# Patient Record
Sex: Male | Born: 1950 | Race: White | Hispanic: No | Marital: Married | Smoking: Former smoker
Health system: Southern US, Community
[De-identification: ages and names within clinical notes are randomized; demographics above are authoritative.]

## PROBLEM LIST (undated history)

## (undated) DIAGNOSIS — K635 Polyp of colon: Secondary | ICD-10-CM

## (undated) DIAGNOSIS — J449 Chronic obstructive pulmonary disease, unspecified: Secondary | ICD-10-CM

## (undated) DIAGNOSIS — K219 Gastro-esophageal reflux disease without esophagitis: Secondary | ICD-10-CM

## (undated) DIAGNOSIS — K5790 Diverticulosis of intestine, part unspecified, without perforation or abscess without bleeding: Secondary | ICD-10-CM

## (undated) DIAGNOSIS — H3530 Unspecified macular degeneration: Secondary | ICD-10-CM

## (undated) HISTORY — DX: Polyp of colon: K63.5

## (undated) HISTORY — DX: Gastro-esophageal reflux disease without esophagitis: K21.9

## (undated) HISTORY — DX: Diverticulosis of intestine, part unspecified, without perforation or abscess without bleeding: K57.90

## (undated) HISTORY — DX: Chronic obstructive pulmonary disease, unspecified: J44.9

## (undated) HISTORY — DX: Unspecified macular degeneration: H35.30

---

## 2004-07-16 ENCOUNTER — Ambulatory Visit: Payer: Self-pay | Admitting: Unknown Physician Specialty

## 2006-12-01 ENCOUNTER — Ambulatory Visit: Payer: Self-pay | Admitting: Unknown Physician Specialty

## 2008-08-24 HISTORY — PX: HERNIA REPAIR: SHX51

## 2010-08-25 LAB — HM COLONOSCOPY

## 2012-01-26 ENCOUNTER — Ambulatory Visit: Payer: Self-pay | Admitting: Surgery

## 2012-01-26 LAB — BASIC METABOLIC PANEL
Anion Gap: 6 — ABNORMAL LOW (ref 7–16)
Chloride: 106 mmol/L (ref 98–107)
Co2: 29 mmol/L (ref 21–32)
Creatinine: 1.1 mg/dL (ref 0.60–1.30)
Glucose: 95 mg/dL (ref 65–99)
Potassium: 4.6 mmol/L (ref 3.5–5.1)
Sodium: 141 mmol/L (ref 136–145)

## 2012-01-26 LAB — CBC WITH DIFFERENTIAL/PLATELET
Eosinophil %: 5 %
Lymphocyte #: 1.4 10*3/uL (ref 1.0–3.6)
MCH: 29.8 pg (ref 26.0–34.0)
Monocyte #: 0.4 x10 3/mm (ref 0.2–1.0)
Neutrophil #: 3.6 10*3/uL (ref 1.4–6.5)
Neutrophil %: 63.2 %
WBC: 5.7 10*3/uL (ref 3.8–10.6)

## 2012-02-02 ENCOUNTER — Ambulatory Visit: Payer: Self-pay | Admitting: Surgery

## 2014-12-16 NOTE — Op Note (Signed)
PATIENT NAME:  Eric Stanton, Eric Stanton MR#:  [id number] DATE OF BIRTH:  10-17-1950  DATE OF PROCEDURE:  02/02/2012  PREOPERATIVE DIAGNOSIS: Umbilical hernia.   POSTOPERATIVE DIAGNOSIS: Umbilical hernia.   PROCEDURE: Umbilical hernia repair.   SURGEON: Quentin Orealph Stanton. Ely III, MD  ANESTHESIA: General.    DESCRIPTION OF PROCEDURE: With the patient in supine position after induction of appropriate general anesthesia, the patient's abdomen was prepped with ChloraPrep, draped with sterile towels. A curvilinear infraumbilical incision was made in the standard fashion, carried down bluntly through subcutaneous tissue. Hernia defect was identified with the preperitoneal fat in the sac. The sac was opened. Preperitoneal fat reduced. Sac amputated. The defect was approximately 2 cm in diameter. The decision was made to repair primarily rather than use mesh with a diameter of the defect. A pants over vest repair was accomplished with 0 Surgilon. The repair was reinforced with 0-0 Surgilon. The area was infiltrated with 0.25% Marcaine for postoperative pain control. The umbilical skin was reapproximated with 0 Vicryl and the subcutaneous space obliterated with 3-0 Vicryl. Skin was clipped. Compressive dressing was applied. The patient returned to recovery room having tolerated procedure well. Sponge, instrument, and needle counts were correct x2 in the Operating Room.   ____________________________ Quentin Orealph Stanton. Ely III, MD rle:cms D: 02/02/2012 08:18:04 ET T: 02/02/2012 11:24:05 ET JOB#: [id number]  cc: Eric Endalph Stanton. Ely III, MD, <Dictator> Eric GuildGlenn R. Beckey DowningWillett, MD Quentin OreALPH Stanton ELY MD ELECTRONICALLY SIGNED 02/03/2012 18:00

## 2018-11-01 ENCOUNTER — Other Ambulatory Visit: Payer: Self-pay | Admitting: Internal Medicine

## 2018-11-01 DIAGNOSIS — J449 Chronic obstructive pulmonary disease, unspecified: Secondary | ICD-10-CM | POA: Insufficient documentation

## 2018-11-01 DIAGNOSIS — K219 Gastro-esophageal reflux disease without esophagitis: Secondary | ICD-10-CM | POA: Insufficient documentation

## 2018-11-04 ENCOUNTER — Other Ambulatory Visit: Payer: Self-pay

## 2018-11-04 ENCOUNTER — Ambulatory Visit (INDEPENDENT_AMBULATORY_CARE_PROVIDER_SITE_OTHER): Payer: Medicare HMO | Admitting: Internal Medicine

## 2018-11-04 ENCOUNTER — Encounter: Payer: Self-pay | Admitting: Internal Medicine

## 2018-11-04 VITALS — BP 124/78 | HR 98 | Ht 71.0 in | Wt 177.0 lb

## 2018-11-04 DIAGNOSIS — J441 Chronic obstructive pulmonary disease with (acute) exacerbation: Secondary | ICD-10-CM | POA: Diagnosis not present

## 2018-11-04 DIAGNOSIS — Z8719 Personal history of other diseases of the digestive system: Secondary | ICD-10-CM | POA: Diagnosis not present

## 2018-11-04 DIAGNOSIS — K219 Gastro-esophageal reflux disease without esophagitis: Secondary | ICD-10-CM | POA: Diagnosis not present

## 2018-11-04 NOTE — Progress Notes (Signed)
Date:  11/04/2018   Name:  Eric Stanton   DOB:  05/14/1951   MRN:  [id number]  Transfer from [facility].  Chief Complaint: Establish Care (Currently on prednisone and amoxicillin from bronchitis from [facility] clinic. Wanted to change PCPs because he can never see one consistent doctor there. No pneumonia-clear xray. )  Gastroesophageal Reflux  He complains of coughing, heartburn and wheezing. He reports no abdominal pain or no chest pain. This is a chronic problem. The problem occurs rarely. The heartburn is located in the substernum. Pertinent negatives include no fatigue. He has tried a PPI for the symptoms. Past procedures do not include an EGD.  Cough  This is a new problem. The current episode started 1 to 4 weeks ago. The problem has been gradually improving. The cough is non-productive. Associated symptoms include heartburn and wheezing. Pertinent negatives include no chest pain, chills, fever, headaches, rash (rash resolved after benadryl - within 90 minutes) or shortness of breath. He has tried a beta-agonist inhaler and oral steroids (and antibiotics - just finishing augmentin) for the symptoms. The treatment provided significant relief. His past medical history is significant for COPD. There is no history of environmental allergies.  Rash - he developed a diffuse pruritic rash over his entire body after starting augmentin.  He took benadryl and the rash resolved after 90 minutes.  He continued on the augmentin and the rash has not recurred.  Review of Systems  Constitutional: Negative for chills, fatigue and fever.  Respiratory: Positive for cough and wheezing. Negative for chest tightness, shortness of breath and stridor.   Cardiovascular: Negative for chest pain, palpitations and leg swelling.  Gastrointestinal: Positive for heartburn. Negative for abdominal pain, diarrhea and vomiting.  Musculoskeletal: Negative for arthralgias, back pain and gait problem.  Skin: Negative for rash (rash  resolved after benadryl - within 90 minutes).  Allergic/Immunologic: Negative for environmental allergies.  Neurological: Negative for dizziness and headaches.  Psychiatric/Behavioral: Negative for sleep disturbance.    Patient Active Problem List   Diagnosis Date Noted  . COPD (chronic obstructive pulmonary disease) (HCC) 11/01/2018  . GERD (gastroesophageal reflux disease) 11/01/2018    Allergies  Allergen Reactions  . Codeine Other (See Comments)    Hyperactivity  . Oxycodone-Acetaminophen Other (See Comments)    Hyperactivity    Past Surgical History:  Procedure Laterality Date  . HERNIA REPAIR  2010    Social History   Tobacco Use  . Smoking status: Former Smoker    Packs/day: 2.00    Years: 20.00    Pack years: 40.00    Types: Cigarettes    Last attempt to quit: 1985    Years since quitting: 35.2  . Smokeless tobacco: Never Used  Substance Use Topics  . Alcohol use: Yes    Alcohol/week: 2.0 standard drinks    Types: 2 Standard drinks or equivalent per week  . Drug use: Never     Medication list has been reviewed and updated.  Current Meds  Medication Sig  . aspirin EC 81 MG tablet Take by mouth.  . docusate sodium (COLACE) 100 MG capsule Take by mouth.  . famotidine (PEPCID) 20 MG tablet Take 20 mg by mouth 2 (two) times daily.  . Ipratropium-Albuterol (COMBIVENT RESPIMAT) 20-100 MCG/ACT AERS respimat 1 INHALATION INTO THE LUNGS 4 TIMES DAILY AS NEEDED FOR WHEEZING PATIENT NEEDS APPT FOR REFILLS  . Multiple Vitamins-Minerals (PRESERVISION AREDS 2+MULTI VIT PO) Take by mouth.  . Omega-3 Fatty Acids (FISH OIL) 1000  MG CAPS Take by mouth.  . Saw Palmetto 1000 MG CAPS Take by mouth.  . vitamin B-12 (CYANOCOBALAMIN) 1000 MCG tablet Take 1,000 mcg by mouth daily.    PHQ 2/9 Scores 11/04/2018  PHQ - 2 Score 0    Physical Exam Vitals signs and nursing note reviewed.  Constitutional:      General: He is not in acute distress.    Appearance: Normal  appearance. He is well-developed.  HENT:     Head: Normocephalic and atraumatic.  Neck:     Musculoskeletal: Normal range of motion and neck supple.     Vascular: No carotid bruit.  Cardiovascular:     Rate and Rhythm: Normal rate and regular rhythm.     Pulses: Normal pulses.  Pulmonary:     Effort: Pulmonary effort is normal. No respiratory distress.     Breath sounds: Decreased breath sounds present. No wheezing or rhonchi.  Musculoskeletal: Normal range of motion.     Right lower leg: No edema.     Left lower leg: No edema.  Lymphadenopathy:     Cervical: No cervical adenopathy.  Skin:    General: Skin is warm and dry.     Findings: No rash.  Neurological:     Mental Status: He is alert and oriented to person, place, and time.  Psychiatric:        Attention and Perception: Attention normal.        Mood and Affect: Mood normal.        Behavior: Behavior normal.        Thought Content: Thought content normal.     Wt Readings from Last 3 Encounters:  11/04/18 177 lb (80.3 kg)    BP 124/78   Pulse 98   Ht 5\' 11"  (1.803 m)   Wt 177 lb (80.3 kg)   SpO2 96%   BMI 24.69 kg/m   Assessment and Plan: 1. Gastroesophageal reflux disease, esophagitis presence not specified Controlled on Pepcid  2. History of esophageal stricture No recent problems  3. Chronic obstructive pulmonary disease with acute exacerbation (HCC) Finish full course of antibiotics and prednisone Continue Combivent PRN   Partially dictated using Animal nutritionist. Any errors are unintentional.  Bari Edward, MD [facility]  11/04/2018

## 2018-12-07 ENCOUNTER — Ambulatory Visit: Payer: Medicare HMO

## 2018-12-16 ENCOUNTER — Other Ambulatory Visit: Payer: Self-pay | Admitting: Internal Medicine

## 2019-02-01 ENCOUNTER — Ambulatory Visit (INDEPENDENT_AMBULATORY_CARE_PROVIDER_SITE_OTHER): Payer: Medicare HMO

## 2019-02-01 VITALS — Ht 71.0 in | Wt 167.0 lb

## 2019-02-01 DIAGNOSIS — Z0000 Encounter for general adult medical examination without abnormal findings: Secondary | ICD-10-CM | POA: Diagnosis not present

## 2019-02-01 NOTE — Progress Notes (Signed)
Subjective:   Eric Stanton is a 68 y.o. male who presents for an Initial Medicare Annual Wellness Visit.  Virtual Visit via Telephone Note  I connected with Eric Stanton on 02/01/19 at  2:40 PM EDT by telephone and verified that I am speaking with the correct person using two identifiers.  Medicare Annual Wellnes visit completed telephonically due to Covid-19 pandemic.   Location: Patient: home Provider: office   I discussed the limitations, risks, security and privacy concerns of performing an evaluation and management service by telephone and the availability of in person appointments. The patient expressed understanding and agreed to proceed.  Some vital signs may be absent or patient reported. Patient states he does not have a blood pressure monitor at home.    Reather Littler, LPN   Review of Systems   Cardiac Risk Factors include: advanced age (>11men, >72 women);male gender    Objective:    Today's Vitals   02/01/19 1448  Weight: 167 lb (75.8 kg)  Height:  (1.803 m)   Body mass index is 23.29 kg/m.  Advanced Directives 02/01/2019  Does Patient Have a Medical Advance Directive? No  Would patient like information on creating a medical advance directive? Yes (MAU/Ambulatory/Procedural Areas - Information given)    Current Medications (verified) Outpatient Encounter Medications as of 02/01/2019  Medication Sig  . aspirin EC 81 MG tablet Take by mouth.  . COMBIVENT RESPIMAT 20-100 MCG/ACT AERS respimat 1 INHALATION INTO THE LUNGS 4 TIMES DAILY AS NEEDED FOR WHEEZING PATIENT NEEDS APPT FOR REFILLS  . docusate sodium (COLACE) 100 MG capsule Take by mouth.  . famotidine (PEPCID) 20 MG tablet Take 20 mg by mouth 2 (two) times daily.  . Multiple Vitamins-Minerals (PRESERVISION AREDS 2+MULTI VIT PO) Take by mouth.  . Omega-3 Fatty Acids (FISH OIL) 1000 MG CAPS Take by mouth.  . Saw Palmetto 1000 MG CAPS Take by mouth.  . vitamin B-12 (CYANOCOBALAMIN) 1000 MCG tablet  Take 1,000 mcg by mouth daily.   No facility-administered encounter medications on file as of 02/01/2019.     Allergies (verified) Codeine and Oxycodone-acetaminophen   History: Past Medical History:  Diagnosis Date  . Colon polyps   . COPD (chronic obstructive pulmonary disease) (HCC)   . Diverticulosis   . GERD (gastroesophageal reflux disease)    Past Surgical History:  Procedure Laterality Date  . HERNIA REPAIR  2010   Family History  Problem Relation Age of Onset  . Liver cancer Mother   . Encopresis Father   . Cancer Maternal Aunt   . Cancer Maternal Uncle    Social History   Socioeconomic History  . Marital status: Married    Spouse name: Not on file  . Number of children: 5  . Years of education: Not on file  . Highest education level: 10th grade  Occupational History  . Not on file  Social Needs  . Financial resource strain: Not hard at all  . Food insecurity:    Worry: Never true    Inability: Never true  . Transportation needs:    Medical: No    Non-medical: No  Tobacco Use  . Smoking status: Former Smoker    Packs/day: 2.00    Years: 20.00    Pack years: 40.00    Types: Cigarettes    Last attempt to quit: 1985    Years since quitting: 35.4  . Smokeless tobacco: Never Used  Substance and Sexual Activity  . Alcohol use: Yes  Alcohol/week: 2.0 standard drinks    Types: 2 Standard drinks or equivalent per week  . Drug use: Never  . Sexual activity: Yes  Lifestyle  . Physical activity:    Days per week: 0 days    Minutes per session: 0 min  . Stress: Only a little  Relationships  . Social connections:    Talks on phone: More than three times a week    Gets together: Three times a week    Attends religious service: More than 4 times per year    Active member of club or organization: No    Attends meetings of clubs or organizations: Never    Relationship status: Married  Other Topics Concern  . Not on file  Social History Narrative  .  Not on file   Tobacco Counseling Counseling given: Not Answered   Clinical Intake:  Pre-visit preparation completed: Yes  Pain : No/denies pain     BMI - recorded: 23.29 Nutritional Status: BMI of 19-24  Normal Nutritional Risks: None Diabetes: No  How often do you need to have someone help you when you read instructions, pamphlets, or other written materials from your doctor or pharmacy?: 1 - Never  Interpreter Needed?: No  Information entered by :: Reather LittlerKasey Maryan Sivak LPN  Activities of Daily Living In your present state of health, do you have any difficulty performing the following activities: 02/01/2019  Hearing? N  Comment declines hearing aids  Vision? N  Comment wears glasses  Difficulty concentrating or making decisions? N  Walking or climbing stairs? N  Dressing or bathing? N  Doing errands, shopping? N  Preparing Food and eating ? N  Using the Toilet? N  In the past six months, have you accidently leaked urine? N  Do you have problems with loss of bowel control? N  Managing your Medications? N  Managing your Finances? N  Housekeeping or managing your Housekeeping? N  Some recent data might be hidden     Immunizations and Health Maintenance Immunization History  Administered Date(s) Administered  . Influenza, High Dose Seasonal PF 05/06/2017  . Influenza,inj,Quad PF,6+ Mos 06/02/2018  . Influenza-Unspecified 05/31/2013, 05/17/2014  . Tdap 01/19/2018   Health Maintenance Due  Topic Date Due  . Hepatitis C Screening  1951/03/09  . PNA vac Low Risk Adult (1 of 2 - PCV13) 10/29/2016    Patient Care Team: Reubin MilanBerglund, Laura H, MD as PCP - General (Internal Medicine) Scot JunElliott, Robert T, MD (Gastroenterology)  Indicate any recent Medical Services you may have received from other than [facility] providers in the past year (date may be approximate).    Assessment:   This is a routine wellness examination for [place].  Hearing/Vision screen Hearing Screening Comments:  Pt denies hearing difficulty  Vision Screening Comments: [facility]  Dietary issues and exercise activities discussed: Current Exercise Habits: The patient does not participate in regular exercise at present, Exercise limited by: None identified  Goals    . Patient Stated     Patient states he would like to stay active and healthy      Depression Screen PHQ 2/9 Scores 02/01/2019 11/04/2018  PHQ - 2 Score 0 0    Fall Risk Fall Risk  02/01/2019 11/04/2018  Falls in the past year? 0 1  Number falls in past yr: 0 1  Injury with Fall? 0 1  Comment - shoulder sore/ ply wood slide out from under foot  Risk for fall due  to : - History of fall(s)  Follow up Falls prevention discussed Falls evaluation completed;Falls prevention discussed   FALL RISK PREVENTION PERTAINING TO THE HOME:  Any stairs in or around the home? No  If so, do they handrails? No   Home free of loose throw rugs in walkways, pet beds, electrical cords, etc? Yes  Adequate lighting in your home to reduce risk of falls? Yes   ASSISTIVE DEVICES UTILIZED TO PREVENT FALLS:  Life alert? No  Use of a cane, walker or w/c? No  Grab bars in the bathroom? Yes  Shower chair or bench in shower? No  Elevated toilet seat or a handicapped toilet? No   DME ORDERS:  DME order needed?  No   TIMED UP AND GO:  Was the test performed? No . Telephonic visit.   Education: Fall risk prevention has been discussed.  Intervention(s) required? No   Cognitive Function:     6CIT Screen 02/01/2019  What Year? 0 points  What month? 0 points  What time? 0 points  Count back from 20 0 points  Months in reverse 2 points  Repeat phrase 2 points  Total Score 4    Screening Tests Health Maintenance  Topic Date Due  . Hepatitis C Screening  09-Mar-1951  . PNA vac Low Risk Adult (1 of 2 - PCV13) 10/29/2016  . INFLUENZA VACCINE  03/25/2019  . COLONOSCOPY  08/25/2020  . TETANUS/TDAP   01/20/2028    Qualifies for Shingles Vaccine? Yes   Due for Shingrix. Education has been provided regarding the importance of this vaccine. Pt has been advised to call insurance company to determine out of pocket expense. Advised may also receive vaccine at local pharmacy or Health Dept. Verbalized acceptance and understanding.   Tdap: Up to date  Flu Vaccine: Up to date  Pneumococcal Vaccine: Due for Pneumococcal vaccine. Does the patient want to receive this vaccine today?  No . Education has been provided regarding the importance of this vaccine but still declined. Advised may receive this vaccine at local pharmacy or Health Dept. Aware to provide a copy of the vaccination record if obtained from local pharmacy or Health Dept. Verbalized acceptance and understanding.  Cancer Screenings:  Colorectal Screening: Completed 2012. Repeat every 10 years  Lung Cancer Screening: (Low Dose CT Chest recommended if Age 35-80 years, 30 pack-year currently smoking OR have quit w/in 15years.) does not qualify.    Additional Screening:  Hepatitis C Screening: does qualify; postponed  Vision Screening: Recommended annual ophthalmology exams for early detection of glaucoma and other disorders of the eye. Is the patient up to date with their annual eye exam?  Yes  Who is the provider or what is the name of the office in which the pt attends annual eye exams? Dr. Clearance CootsShade   Dental Screening: Recommended annual dental exams for proper oral hygiene  Community Resource Referral:  CRR required this visit?  No       Plan:   I have personally reviewed and addressed the Medicare Annual Wellness questionnaire and have noted the following in the patient's chart:  A. Medical and social history B. Use of alcohol, tobacco or illicit drugs  C. Current medications and supplements D. Functional ability and status E.  Nutritional status F.  Physical activity G. Advance directives H. List of other physicians  I.  Hospitalizations, surgeries, and ER visits in previous 12 months J.  Vitals K. Screenings such as hearing and vision if needed, cognitive and depression  L. Referrals and appointments   In addition, I have reviewed and discussed with patient certain preventive protocols, quality metrics, and best practice recommendations. A written personalized care plan for preventive services as well as general preventive health recommendations were provided to patient.   Signed,  Clemetine Marker, LPN Nurse Health Advisor   Nurse Notes: pt doing well and appreciative of visit today. He is scheduled for yearly exam next week.

## 2019-02-01 NOTE — Patient Instructions (Signed)
[name] , Thank you for taking time to come for your Medicare Wellness Visit. I appreciate your ongoing commitment to your health goals. Please review the following plan we discussed and let me know if I can assist you in the future.   Screening recommendations/referrals: Colonoscopy: done 2012. Repeat in 2022. Recommended yearly ophthalmology/optometry visit for glaucoma screening and checkup Recommended yearly dental visit for hygiene and checkup  Vaccinations: Influenza vaccine: done 06/12/18 Pneumococcal vaccine: postponed Tdap vaccine: done 01/19/18 Shingles vaccine: Shingrix discussed. Please contact your pharmacy for coverage information.   Advanced directives: Advance directive discussed with you today. I have mailed a copy for you to complete at home and have notarized. Once this is complete please bring a copy in to our office so we can scan it into your chart.  Conditions/risks identified: recommend 150 minutes per week of physical activity  Next appointment: Please follow up in one year for your Medicare Annual Wellness visit.    Preventive Care 43 Years and Older, Male Preventive care refers to lifestyle choices and visits with your health care provider that can promote health and wellness. What does preventive care include?  A yearly physical exam. This is also called an annual well check.  Dental exams once or twice a year.  Routine eye exams. Ask your health care provider how often you should have your eyes checked.  Personal lifestyle choices, including:  Daily care of your teeth and gums.  Regular physical activity.  Eating a healthy diet.  Avoiding tobacco and drug use.  Limiting alcohol use.  Practicing safe sex.  Taking low doses of aspirin every day.  Taking vitamin and mineral supplements as recommended by your health care provider. What happens during an annual well check? The services and screenings done by your health care provider during  your annual well check will depend on your age, overall health, lifestyle risk factors, and family history of disease. Counseling  Your health care provider may ask you questions about your:  Alcohol use.  Tobacco use.  Drug use.  Emotional well-being.  Home and relationship well-being.  Sexual activity.  Eating habits.  History of falls.  Memory and ability to understand (cognition).  Work and work Statistician. Screening  You may have the following tests or measurements:  Height, weight, and BMI.  Blood pressure.  Lipid and cholesterol levels. These may be checked every 5 years, or more frequently if you are over 69 years old.  Skin check.  Lung cancer screening. You may have this screening every year starting at age 72 if you have a 30-pack-year history of smoking and currently smoke or have quit within the past 15 years.  Fecal occult blood test (FOBT) of the stool. You may have this test every year starting at age 81.  Flexible sigmoidoscopy or colonoscopy. You may have a sigmoidoscopy every 5 years or a colonoscopy every 10 years starting at age 19.  Prostate cancer screening. Recommendations will vary depending on your family history and other risks.  Hepatitis C blood test.  Hepatitis B blood test.  Sexually transmitted disease (STD) testing.  Diabetes screening. This is done by checking your blood sugar (glucose) after you have not eaten for a while (fasting). You may have this done every 1-3 years.  Abdominal aortic aneurysm (AAA) screening. You may need this if you are a current or former smoker.  Osteoporosis. You may be screened starting at age 32 if you are at high risk. Talk with your health  care provider about your test results, treatment options, and if necessary, the need for more tests. Vaccines  Your health care provider may recommend certain vaccines, such as:  Influenza vaccine. This is recommended every year.  Tetanus, diphtheria, and  acellular pertussis (Tdap, Td) vaccine. You may need a Td booster every 10 years.  Zoster vaccine. You may need this after age 16.  Pneumococcal 13-valent conjugate (PCV13) vaccine. One dose is recommended after age 35.  Pneumococcal polysaccharide (PPSV23) vaccine. One dose is recommended after age 65. Talk to your health care provider about which screenings and vaccines you need and how often you need them. This information is not intended to replace advice given to you by your health care provider. Make sure you discuss any questions you have with your health care provider. Document Released: 09/06/2015 Document Revised: 04/29/2016 Document Reviewed: 06/11/2015 Elsevier Interactive Patient Education  2017 [employer] Prevention in the Home Falls can cause injuries. They can happen to people of all ages. There are many things you can do to make your home safe and to help prevent falls. What can I do on the outside of my home?  Regularly fix the edges of walkways and driveways and fix any cracks.  Remove anything that might make you trip as you walk through a door, such as a raised step or threshold.  Trim any bushes or trees on the path to your home.  Use bright outdoor lighting.  Clear any walking paths of anything that might make someone trip, such as rocks or tools.  Regularly check to see if handrails are loose or broken. Make sure that both sides of any steps have handrails.  Any raised decks and porches should have guardrails on the edges.  Have any leaves, snow, or ice cleared regularly.  Use sand or salt on walking paths during winter.  Clean up any spills in your garage right away. This includes oil or grease spills. What can I do in the bathroom?  Use night lights.  Install grab bars by the toilet and in the tub and shower. Do not use towel bars as grab bars.  Use non-skid mats or decals in the tub or shower.  If you need to sit down in the shower, use  a plastic, non-slip stool.  Keep the floor dry. Clean up any water that spills on the floor as soon as it happens.  Remove soap buildup in the tub or shower regularly.  Attach bath mats securely with double-sided non-slip rug tape.  Do not have throw rugs and other things on the floor that can make you trip. What can I do in the bedroom?  Use night lights.  Make sure that you have a light by your bed that is easy to reach.  Do not use any sheets or blankets that are too big for your bed. They should not hang down onto the floor.  Have a firm chair that has side arms. You can use this for support while you get dressed.  Do not have throw rugs and other things on the floor that can make you trip. What can I do in the kitchen?  Clean up any spills right away.  Avoid walking on wet floors.  Keep items that you use a lot in easy-to-reach places.  If you need to reach something above you, use a strong step stool that has a grab bar.  Keep electrical cords out of the way.  Do not use floor  polish or wax that makes floors slippery. If you must use wax, use non-skid floor wax.  Do not have throw rugs and other things on the floor that can make you trip. What can I do with my stairs?  Do not leave any items on the stairs.  Make sure that there are handrails on both sides of the stairs and use them. Fix handrails that are broken or loose. Make sure that handrails are as long as the stairways.  Check any carpeting to make sure that it is firmly attached to the stairs. Fix any carpet that is loose or worn.  Avoid having throw rugs at the top or bottom of the stairs. If you do have throw rugs, attach them to the floor with carpet tape.  Make sure that you have a light switch at the top of the stairs and the bottom of the stairs. If you do not have them, ask someone to add them for you. What else can I do to help prevent falls?  Wear shoes that:  Do not have high heels.  Have  rubber bottoms.  Are comfortable and fit you well.  Are closed at the toe. Do not wear sandals.  If you use a stepladder:  Make sure that it is fully opened. Do not climb a closed stepladder.  Make sure that both sides of the stepladder are locked into place.  Ask someone to hold it for you, if possible.  Clearly mark and make sure that you can see:  Any grab bars or handrails.  First and last steps.  Where the edge of each step is.  Use tools that help you move around (mobility aids) if they are needed. These include:  Canes.  Walkers.  Scooters.  Crutches.  Turn on the lights when you go into a dark area. Replace any light bulbs as soon as they burn out.  Set up your furniture so you have a clear path. Avoid moving your furniture around.  If any of your floors are uneven, fix them.  If there are any pets around you, be aware of where they are.  Review your medicines with your doctor. Some medicines can make you feel dizzy. This can increase your chance of falling. Ask your doctor what other things that you can do to help prevent falls. This information is not intended to replace advice given to you by your health care provider. Make sure you discuss any questions you have with your health care provider. Document Released: 06/06/2009 Document Revised: 01/16/2016 Document Reviewed: 09/14/2014 Elsevier Interactive Patient Education  2017 [employer].

## 2019-02-08 ENCOUNTER — Ambulatory Visit (INDEPENDENT_AMBULATORY_CARE_PROVIDER_SITE_OTHER): Payer: Medicare HMO | Admitting: Internal Medicine

## 2019-02-08 ENCOUNTER — Other Ambulatory Visit: Payer: Self-pay

## 2019-02-08 ENCOUNTER — Encounter: Payer: Self-pay | Admitting: Internal Medicine

## 2019-02-08 VITALS — BP 114/72 | HR 79 | Ht 71.0 in | Wt 169.0 lb

## 2019-02-08 DIAGNOSIS — Z1159 Encounter for screening for other viral diseases: Secondary | ICD-10-CM

## 2019-02-08 DIAGNOSIS — Z0000 Encounter for general adult medical examination without abnormal findings: Secondary | ICD-10-CM | POA: Diagnosis not present

## 2019-02-08 DIAGNOSIS — K219 Gastro-esophageal reflux disease without esophagitis: Secondary | ICD-10-CM

## 2019-02-08 DIAGNOSIS — Z125 Encounter for screening for malignant neoplasm of prostate: Secondary | ICD-10-CM

## 2019-02-08 DIAGNOSIS — Z23 Encounter for immunization: Secondary | ICD-10-CM | POA: Diagnosis not present

## 2019-02-08 DIAGNOSIS — J441 Chronic obstructive pulmonary disease with (acute) exacerbation: Secondary | ICD-10-CM

## 2019-02-08 LAB — POCT URINALYSIS DIPSTICK
Bilirubin, UA: NEGATIVE
Blood, UA: NEGATIVE
Glucose, UA: NEGATIVE
Ketones, UA: NEGATIVE
Leukocytes, UA: NEGATIVE
Nitrite, UA: NEGATIVE
Protein, UA: NEGATIVE
Spec Grav, UA: 1.01 (ref 1.010–1.025)
Urobilinogen, UA: 0.2 E.U./dL
pH, UA: 5 (ref 5.0–8.0)

## 2019-02-08 NOTE — Patient Instructions (Addendum)
Use Dulera inhaler - one puff twice a day  Check on cost for the following inhalers:  Dulera  Symbicort  Advair  BreoPneumococcal Conjugate Vaccine (PCV13): What You Need to Know 1. Why get vaccinated? Vaccination can protect both children and adults from pneumococcal disease. Pneumococcal disease is caused by bacteria that can spread from person to person through close contact. It can cause ear infections, and it can also lead to more serious infections of the:  Lungs (pneumonia),  Blood (bacteremia), and  Covering of the brain and spinal cord (meningitis). Pneumococcal pneumonia is most common among adults. Pneumococcal meningitis can cause deafness and brain damage, and it kills about 1 child in 10 who get it. Anyone can get pneumococcal disease, but children under 462 years of age and adults 1865 years and older, people with certain medical conditions, and cigarette smokers are at the highest risk. Before there was a vaccine, the [place] saw:  more than 700 cases of meningitis,  about 13,000 blood infections,  about 5 million ear infections, and  about 200 deaths in children under 5 each year from pneumococcal disease. Since vaccine became available, severe pneumococcal disease in these children has fallen by 88%. About 18,000 older adults die of pneumococcal disease each year in the [place]. Treatment of pneumococcal infections with penicillin and other drugs is not as effective as it used to be, because some strains of the disease have become resistant to these drugs. This makes prevention of the disease, through vaccination, even more important. 2. PCV13 vaccine Pneumococcal conjugate vaccine (called PCV13) protects against 13 types of pneumococcal bacteria. PCV13 is routinely given to children at 2, 4, 6, and 2512-415 months of age. It is also recommended for children and adults 282 to 67 years of age with certain health conditions, and for all adults 67 years of age  and older. Your doctor can give you details. 3. Some people should not get this vaccine Anyone who has ever had a life-threatening allergic reaction to a dose of this vaccine, to an earlier pneumococcal vaccine called PCV7, or to any vaccine containing diphtheria toxoid (for example, DTaP), should not get PCV13. Anyone with a severe allergy to any component of PCV13 should not get the vaccine. Tell your doctor if the person being vaccinated has any severe allergies. If the person scheduled for vaccination is not feeling well, your healthcare provider might decide to reschedule the shot on another day. 4. Risks of a vaccine reaction With any medicine, including vaccines, there is a chance of reactions. These are usually mild and go away on their own, but serious reactions are also possible. Problems reported following PCV13 varied by age and dose in the series. The most common problems reported among children were:  About half became drowsy after the shot, had a temporary loss of appetite, or had redness or tenderness where the shot was given.  About 1 out of 3 had swelling where the shot was given.  About 1 out of 3 had a mild fever, and about 1 in 20 had a fever over 102.97F.  Up to about 8 out of 10 became fussy or irritable. Adults have reported pain, redness, and swelling where the shot was given; also mild fever, fatigue, headache, chills, or muscle pain. Young children who get PCV13 along with inactivated flu vaccine at the same time may be at increased risk for seizures caused by fever. Ask your doctor for more information. Problems that could happen after any vaccine:  People sometimes faint after a medical procedure, including vaccination. Sitting or lying down for about 15 minutes can help prevent fainting, and injuries caused by a fall. Tell your doctor if you feel dizzy, or have vision changes or ringing in the ears.  Some older children and adults get severe pain in the shoulder  and have difficulty moving the arm where a shot was given. This happens very rarely.  Any medication can cause a severe allergic reaction. Such reactions from a vaccine are very rare, estimated at about 1 in a million doses, and would happen within a few minutes to a few hours after the vaccination. As with any medicine, there is a very small chance of a vaccine causing a serious injury or death. The safety of vaccines is always being monitored. For more information, visit: http://www.aguilar.org/ 5. What if there is a serious reaction? What should I look for?  Look for anything that concerns you, such as signs of a severe allergic reaction, very high fever, or unusual behavior. Signs of a severe allergic reaction can include hives, swelling of the face and throat, difficulty breathing, a fast heartbeat, dizziness, and weakness-usually within a few minutes to a few hours after the vaccination. What should I do?  If you think it is a severe allergic reaction or other emergency that can't wait, call 9-1-1 or get the person to the nearest hospital. Otherwise, call your doctor. Reactions should be reported to the Vaccine Adverse Event Reporting System (VAERS). Your doctor should file this report, or you can do it yourself through the VAERS web site at www.vaers.SamedayNews.es, or by calling (519) 777-8079. VAERS does not give medical advice. 6. The National Vaccine Injury Compensation Program The [employer] Vaccine Injury Compensation Program (VICP) is a federal program that was created to compensate people who may have been injured by certain vaccines. Persons who believe they may have been injured by a vaccine can learn about the program and about filing a claim by calling 7032546611 or visiting the [employer] website at GoldCloset.com.ee. There is a time limit to file a claim for compensation. 7. How can I learn more?  Ask your healthcare provider. He or she can give you the vaccine package  insert or suggest other sources of information.  Call your local or state health department.  Contact the Centers for Disease Control and Prevention (CDC): ? Call 9398664114 (1-800-CDC-INFO) or ? Visit CDC's website at http://hunter.com/ Vaccine Information Statement PCV13 Vaccine (06/28/2014) This information is not intended to replace advice given to you by your health care provider. Make sure you discuss any questions you have with your health care provider. Document Released: 06/07/2006 Document Revised: 03/22/2018 Document Reviewed: 03/22/2018 Elsevier Interactive Patient Education  2019 [employer].

## 2019-02-08 NOTE — Progress Notes (Signed)
Date:  02/08/2019   Name:  Eric Stanton   DOB:  04/04/1951   MRN:  [id number]   Chief Complaint: Annual Exam Eric Stanton is a 68 y.o. male who presents today for his Complete Annual Exam. He feels fairly well. He reports exercising playing golf and stays active. He reports he is sleeping fairly well.   Colonoscopy 2012 Tdap 2019 No record of PPV-23  Gastroesophageal Reflux He complains of coughing and wheezing. He reports no chest pain or no sore throat. This is a recurrent problem. The problem occurs occasionally. Pertinent negatives include no fatigue. He has tried a histamine-2 antagonist for the symptoms. Past procedures include an EGD (for esophageal stricture).  Shortness of Breath This is a chronic problem. The problem has been unchanged. Associated symptoms include wheezing. Pertinent negatives include no chest pain, fever, headaches, leg swelling or sore throat. His past medical history is significant for chronic lung disease and COPD.  He is having more daily wheezing and SOB and uses combivent up to 4 times a day.  Review of Systems  Constitutional: Negative for chills, fatigue, fever and unexpected weight change.  HENT: Positive for hearing loss and sneezing. Negative for sore throat and trouble swallowing.   Eyes: Negative for visual disturbance.  Respiratory: Positive for cough, shortness of breath and wheezing. Negative for chest tightness.   Cardiovascular: Negative for chest pain, palpitations and leg swelling.  Gastrointestinal: Positive for constipation. Negative for blood in stool.  Genitourinary: Negative for difficulty urinating, frequency, hematuria and urgency.  Musculoskeletal: Positive for arthralgias (shoulder right). Negative for gait problem.  Skin: Negative for wound.  Allergic/Immunologic: Positive for environmental allergies.  Neurological: Negative for dizziness and headaches.  Hematological: Negative for adenopathy.  Psychiatric/Behavioral: Negative  for dysphoric mood and sleep disturbance. The patient is not nervous/anxious.     Patient Active Problem List   Diagnosis Date Noted  . History of esophageal stricture 11/04/2018  . COPD (chronic obstructive pulmonary disease) (HCC) 11/01/2018  . GERD (gastroesophageal reflux disease) 11/01/2018    Allergies  Allergen Reactions  . Codeine Other (See Comments)    Hyperactivity  . Oxycodone-Acetaminophen Other (See Comments)    Hyperactivity    Past Surgical History:  Procedure Laterality Date  . HERNIA REPAIR  2010    Social History   Tobacco Use  . Smoking status: Former Smoker    Packs/day: 2.00    Years: 20.00    Pack years: 40.00    Types: Cigarettes    Quit date: 1985    Years since quitting: 35.4  . Smokeless tobacco: Never Used  Substance Use Topics  . Alcohol use: Yes    Alcohol/week: 2.0 standard drinks    Types: 2 Standard drinks or equivalent per week  . Drug use: Never     Medication list has been reviewed and updated.  Current Meds  Medication Sig  . aspirin EC 81 MG tablet Take by mouth.  . COMBIVENT RESPIMAT 20-100 MCG/ACT AERS respimat 1 INHALATION INTO THE LUNGS 4 TIMES DAILY AS NEEDED FOR WHEEZING PATIENT NEEDS APPT FOR REFILLS  . docusate sodium (COLACE) 100 MG capsule Take by mouth.  . famotidine (PEPCID) 20 MG tablet Take 20 mg by mouth 2 (two) times daily.  . Multiple Vitamins-Minerals (PRESERVISION AREDS 2+MULTI VIT PO) Take by mouth.  . Omega-3 Fatty Acids (FISH OIL) 1000 MG CAPS Take by mouth.  . Saw Palmetto 1000 MG CAPS Take by mouth.  . vitamin B-12 (CYANOCOBALAMIN) 1000  MCG tablet Take 1,000 mcg by mouth daily.    PHQ 2/9 Scores 02/08/2019 02/01/2019 11/04/2018  PHQ - 2 Score 0 0 0    BP Readings from Last 3 Encounters:  02/08/19 114/72  11/04/18 124/78    Physical Exam Vitals signs and nursing note reviewed.  Constitutional:      Appearance: Normal appearance. He is well-developed.  HENT:     Head: Normocephalic.      Right Ear: Tympanic membrane, ear canal and external ear normal.     Left Ear: Tympanic membrane, ear canal and external ear normal.     Nose: Nose normal.  Eyes:     Conjunctiva/sclera: Conjunctivae normal.     Pupils: Pupils are equal, round, and reactive to light.  Neck:     Musculoskeletal: Normal range of motion and neck supple.     Thyroid: No thyromegaly.     Vascular: No carotid bruit.  Cardiovascular:     Rate and Rhythm: Normal rate and regular rhythm.     Heart sounds: Normal heart sounds.  Pulmonary:     Effort: Pulmonary effort is normal.     Breath sounds: Decreased breath sounds present. No wheezing or rhonchi.  Chest:     Breasts:        Right: No mass.        Left: No mass.  Abdominal:     General: Bowel sounds are normal.     Palpations: Abdomen is soft.     Tenderness: There is no abdominal tenderness.  Musculoskeletal: Normal range of motion.     Right lower leg: No edema.     Left lower leg: No edema.  Lymphadenopathy:     Cervical: No cervical adenopathy.  Skin:    General: Skin is warm and dry.     Capillary Refill: Capillary refill takes less than 2 seconds.     Comments: Peeling over chest and abdomen from recent sunburn  Neurological:     General: No focal deficit present.     Mental Status: He is alert and oriented to person, place, and time.     Deep Tendon Reflexes: Reflexes are normal and symmetric.  Psychiatric:        Speech: Speech normal.        Behavior: Behavior normal.        Thought Content: Thought content normal.        Judgment: Judgment normal.     Wt Readings from Last 3 Encounters:  02/08/19 169 lb (76.7 kg)  02/01/19 167 lb (75.8 kg)  11/04/18 177 lb (80.3 kg)    BP 114/72   Pulse 79   Ht 5\' 11"  (1.803 m)   Wt 169 lb (76.7 kg)   SpO2 99%   BMI 23.57 kg/m   Assessment and Plan: 1. Annual physical exam Normal exam - Comprehensive metabolic panel - Lipid panel - POCT urinalysis dipstick  2. Gastroesophageal  reflux disease, esophagitis presence not specified Controlled on Pepcid - CBC with Differential/Platelet  3. Chronic obstructive pulmonary disease with acute exacerbation (HCC) Samples of Dulera 200 mcg given Pt to check on cost of several brands - CBC with Differential/Platelet  4. Prostate cancer screening DRE deferred to lack of sx - PSA  5. Need for hepatitis C screening test - Hepatitis C antibody  6. Need for vaccination for pneumococcus - Pneumococcal conjugate vaccine 13-valent IM   Partially dictated using Animal nutritionistDragon software. Any errors are unintentional.  Bari EdwardLaura Reinhold Rickey, MD [facility]  [employer]  02/08/2019

## 2019-02-09 ENCOUNTER — Encounter: Payer: Self-pay | Admitting: Internal Medicine

## 2019-02-09 DIAGNOSIS — E785 Hyperlipidemia, unspecified: Secondary | ICD-10-CM | POA: Insufficient documentation

## 2019-02-09 LAB — COMPREHENSIVE METABOLIC PANEL
ALT: 7 IU/L (ref 0–44)
AST: 5 IU/L (ref 0–40)
Albumin/Globulin Ratio: 1.8 (ref 1.2–2.2)
Albumin: 4.4 g/dL (ref 3.8–4.8)
Alkaline Phosphatase: 87 IU/L (ref 39–117)
BUN/Creatinine Ratio: 6 — ABNORMAL LOW (ref 10–24)
BUN: 7 mg/dL — ABNORMAL LOW (ref 8–27)
Bilirubin Total: 0.4 mg/dL (ref 0.0–1.2)
CO2: 24 mmol/L (ref 20–29)
Calcium: 9.5 mg/dL (ref 8.6–10.2)
Chloride: 101 mmol/L (ref 96–106)
Creatinine, Ser: 1.08 mg/dL (ref 0.76–1.27)
GFR calc Af Amer: 82 mL/min/{1.73_m2} (ref >59)
GFR calc non Af Amer: 71 mL/min/{1.73_m2} (ref >59)
Globulin, Total: 2.4 g/dL (ref 1.5–4.5)
Glucose: 106 mg/dL — ABNORMAL HIGH (ref 65–99)
Potassium: 4.3 mmol/L (ref 3.5–5.2)
Sodium: 139 mmol/L (ref 134–144)
Total Protein: 6.8 g/dL (ref 6.0–8.5)

## 2019-02-09 LAB — CBC WITH DIFFERENTIAL/PLATELET
Basophils Absolute: 0 10*3/uL (ref 0.0–0.2)
Basos: 1 %
EOS (ABSOLUTE): 0.4 10*3/uL (ref 0.0–0.4)
Eos: 11 %
Hematocrit: 42.2 % (ref 37.5–51.0)
Hemoglobin: 14.6 g/dL (ref 13.0–17.7)
Immature Grans (Abs): 0 10*3/uL (ref 0.0–0.1)
Immature Granulocytes: 0 %
Lymphocytes Absolute: 0.9 10*3/uL (ref 0.7–3.1)
Lymphs: 24 %
MCH: 31.3 pg (ref 26.6–33.0)
MCHC: 34.6 g/dL (ref 31.5–35.7)
MCV: 91 fL (ref 79–97)
Monocytes Absolute: 0.3 10*3/uL (ref 0.1–0.9)
Monocytes: 7 %
Neutrophils Absolute: 2.2 10*3/uL (ref 1.4–7.0)
Neutrophils: 57 %
Platelets: 260 10*3/uL (ref 150–450)
RBC: 4.66 x10E6/uL (ref 4.14–5.80)
RDW: 12.3 % (ref 11.6–15.4)
WBC: 3.8 10*3/uL (ref 3.4–10.8)

## 2019-02-09 LAB — HEPATITIS C ANTIBODY: Hep C Virus Ab: <0.1 s/co ratio (ref 0.0–0.9)

## 2019-02-09 LAB — LIPID PANEL
Chol/HDL Ratio: 3.7 ratio (ref 0.0–5.0)
Cholesterol, Total: 177 mg/dL (ref 100–199)
HDL: 48 mg/dL (ref >39)
LDL Calculated: 110 mg/dL — ABNORMAL HIGH (ref 0–99)
Triglycerides: 96 mg/dL (ref 0–149)
VLDL Cholesterol Cal: 19 mg/dL (ref 5–40)

## 2019-02-09 LAB — PSA: Prostate Specific Ag, Serum: 1.2 ng/mL (ref 0.0–4.0)

## 2019-02-10 ENCOUNTER — Other Ambulatory Visit: Payer: Self-pay | Admitting: Internal Medicine

## 2019-02-10 DIAGNOSIS — E782 Mixed hyperlipidemia: Secondary | ICD-10-CM

## 2019-02-10 MED ORDER — EZETIMIBE 10 MG PO TABS: 10.0000 mg | ORAL_TABLET | Freq: Every day | ORAL | 3 refills | Status: DC

## 2019-04-06 ENCOUNTER — Other Ambulatory Visit: Payer: Self-pay | Admitting: Internal Medicine

## 2019-07-19 ENCOUNTER — Ambulatory Visit
Admission: EM | Admit: 2019-07-19 | Discharge: 2019-07-19 | Disposition: A | Discharge status: Home / Self Care | Payer: Medicare HMO | Attending: Family Medicine | Admitting: Family Medicine

## 2019-07-19 ENCOUNTER — Ambulatory Visit (INDEPENDENT_AMBULATORY_CARE_PROVIDER_SITE_OTHER): Payer: Medicare HMO

## 2019-07-19 ENCOUNTER — Encounter: Payer: Self-pay | Admitting: Emergency Medicine

## 2019-07-19 ENCOUNTER — Other Ambulatory Visit: Payer: Self-pay

## 2019-07-19 DIAGNOSIS — S46912A Strain of unspecified muscle, fascia and tendon at shoulder and upper arm level, left arm, initial encounter: Secondary | ICD-10-CM

## 2019-07-19 DIAGNOSIS — R519 Headache, unspecified: Secondary | ICD-10-CM

## 2019-07-19 DIAGNOSIS — S29012A Strain of muscle and tendon of back wall of thorax, initial encounter: Secondary | ICD-10-CM | POA: Diagnosis not present

## 2019-07-19 DIAGNOSIS — M25512 Pain in left shoulder: Secondary | ICD-10-CM | POA: Diagnosis not present
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
## 2019-07-19 NOTE — Discharge Instructions (Signed)
Rest, ice/heat, tylenol/advil
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
## 2019-08-14 ENCOUNTER — Ambulatory Visit: Payer: Medicare HMO | Admitting: Internal Medicine

## 2019-09-06 ENCOUNTER — Encounter: Payer: Self-pay | Admitting: Internal Medicine

## 2019-09-06 ENCOUNTER — Ambulatory Visit (INDEPENDENT_AMBULATORY_CARE_PROVIDER_SITE_OTHER): Payer: Medicare HMO | Admitting: Internal Medicine

## 2019-09-06 ENCOUNTER — Other Ambulatory Visit: Payer: Self-pay

## 2019-09-06 DIAGNOSIS — E782 Mixed hyperlipidemia: Secondary | ICD-10-CM | POA: Diagnosis not present

## 2019-09-06 DIAGNOSIS — J441 Chronic obstructive pulmonary disease with (acute) exacerbation: Secondary | ICD-10-CM

## 2019-09-06 MED ORDER — EZETIMIBE 10 MG PO TABS: 10.0000 mg | ORAL_TABLET | Freq: Every day | ORAL | 3 refills | Status: DC

## 2019-09-06 NOTE — Patient Instructions (Addendum)
Call insurance and see what Symbicort costs now.   Options are Eric Stanton, Advair.  Also consider getting a nebulizer machine and nebulizing Pulmicort twice a day.  These may need to come from a specialty pharmacy.  Please call insurance if the inhalers above are to expensive to see if these can be covered.  Covid Vaccine locations: [facility] [phone] [place] [phone]

## 2019-09-06 NOTE — Progress Notes (Signed)
Date:  09/06/2019   Name:  Eric Stanton   DOB:  06/09/1951   MRN:  [id number]   Chief Complaint: Hyperlipidemia (Started on Zetia in June. Lipid recheck. )  Hyperlipidemia This is a chronic problem. Recent lipid tests were reviewed and are high. Associated symptoms include shortness of breath. Pertinent negatives include no chest pain. Current antihyperlipidemic treatment includes ezetimibe (started last visit for high lipids). There are no compliance problems.    COPD - using combivent 3-4 times per day. He is not using a controlled inhaler due to cost.  In the past he used Symbicort with some benefit.  He does not have a nebulizer and has never used one at home.  Lab Results  Component Value Date   CREATININE 1.08 02/08/2019   BUN 7 (L) 02/08/2019   NA 139 02/08/2019   K 4.3 02/08/2019   CL 101 02/08/2019   CO2 24 02/08/2019   Lab Results  Component Value Date   CHOL 177 02/08/2019   HDL 48 02/08/2019   LDLCALC 110 (H) 02/08/2019   TRIG 96 02/08/2019   CHOLHDL 3.7 02/08/2019   No results found for: TSH No results found for: HGBA1C   Review of Systems  Constitutional: Negative for chills, fatigue and fever.  Respiratory: Positive for cough, shortness of breath and wheezing. Negative for chest tightness.   Cardiovascular: Negative for chest pain and leg swelling.  Gastrointestinal: Negative for abdominal pain, constipation and diarrhea.  Neurological: Negative for dizziness and headaches.  Psychiatric/Behavioral: Positive for dysphoric mood. Negative for sleep disturbance. The patient is not nervous/anxious.     Patient Active Problem List   Diagnosis Date Noted  . Mild hyperlipidemia 02/09/2019  . History of esophageal stricture 11/04/2018  . COPD (chronic obstructive pulmonary disease) ([place]) 11/01/2018  . GERD (gastroesophageal reflux disease) 11/01/2018    Allergies  Allergen Reactions  . Codeine Other (See Comments)    Hyperactivity  .  Oxycodone-Acetaminophen Other (See Comments)    Hyperactivity    Past Surgical History:  Procedure Laterality Date  . HERNIA REPAIR  2010    Social History   Tobacco Use  . Smoking status: Former Smoker    Packs/day: 2.00    Years: 20.00    Pack years: 40.00    Types: Cigarettes    Quit date: 1985    Years since quitting: 36.0  . Smokeless tobacco: Never Used  Substance Use Topics  . Alcohol use: Yes    Alcohol/week: 2.0 standard drinks    Types: 2 Standard drinks or equivalent per week  . Drug use: Never     Medication list has been reviewed and updated.  Current Meds  Medication Sig  . aspirin EC 81 MG tablet Take by mouth.  . COMBIVENT RESPIMAT 20-100 MCG/ACT AERS respimat 1 INHALATION INTO THE LUNGS 4 TIMES DAILY AS NEEDED FOR WHEEZING  . docusate sodium (COLACE) 100 MG capsule Take by mouth.  . ezetimibe (ZETIA) 10 MG tablet Take 1 tablet (10 mg total) by mouth daily.  . famotidine (PEPCID) 20 MG tablet Take 20 mg by mouth 2 (two) times daily.  . Multiple Vitamins-Minerals (PRESERVISION AREDS 2+MULTI VIT PO) Take by mouth.  . Omega-3 Fatty Acids (FISH OIL) 1000 MG CAPS Take by mouth.  . Saw Palmetto 1000 MG CAPS Take by mouth.  . vitamin B-12 (CYANOCOBALAMIN) 1000 MCG tablet Take 1,000 mcg by mouth daily.    PHQ 2/9 Scores 09/06/2019 09/06/2019 02/08/2019 02/01/2019  PHQ - 2  Score 6 0 0 0  PHQ- 9 Score 12 - - -    BP Readings from Last 3 Encounters:  09/06/19 122/74  07/19/19 124/76  02/08/19 114/72    Physical Exam Vitals and nursing note reviewed.  Constitutional:      General: He is not in acute distress.    Appearance: Normal appearance. He is well-developed.  HENT:     Head: Normocephalic and atraumatic.  Cardiovascular:     Rate and Rhythm: Normal rate and regular rhythm.  Pulmonary:     Effort: Pulmonary effort is normal. No accessory muscle usage or respiratory distress.     Breath sounds: Examination of the right-upper field reveals wheezing.  Examination of the left-upper field reveals wheezing. Examination of the right-middle field reveals wheezing. Examination of the left-middle field reveals wheezing. Examination of the right-lower field reveals wheezing. Examination of the left-lower field reveals wheezing. Decreased breath sounds and wheezing present.  Musculoskeletal:        General: Normal range of motion.     Cervical back: Normal range of motion.  Lymphadenopathy:     Cervical: No cervical adenopathy.  Skin:    General: Skin is warm and dry.     Findings: No rash.  Neurological:     Mental Status: He is alert and oriented to person, place, and time.  Psychiatric:        Attention and Perception: Attention normal.        Mood and Affect: Mood normal.        Behavior: Behavior normal.        Thought Content: Thought content normal.     Wt Readings from Last 3 Encounters:  09/06/19 167 lb (75.8 kg)  07/19/19 165 lb (74.8 kg)  02/08/19 169 lb (76.7 kg)    BP 122/74   Pulse 87   Temp 98 F (36.7 C) (Oral)   Ht 5\' 10"  (1.778 m)   Wt 167 lb (75.8 kg)   SpO2 98%   BMI 23.96 kg/m   Assessment and Plan: 1. Chronic obstructive pulmonary disease with acute exacerbation (HCC) I have asked him to check into the cost of Symbicort and several other maintenance medications on the chance that prices have improved. An alternative would be nebulized Pulmicort twice a day but that might involve a specialty pharmacy. He will ask his daughter for help then let me know what he learns. Instructions to schedule the Covid vaccine when his age group comes up is given  2. Mixed hyperlipidemia Tolerating Zetia without side effects - will check labs to determine efficacy - Lipid panel - ezetimibe (ZETIA) 10 MG tablet; Take 1 tablet (10 mg total) by mouth daily.  Dispense: 90 tablet; Refill: 3   Partially dictated using . Any errors are unintentional.  Animal nutritionist, MD [facility]  09/06/2019

## 2019-09-07 LAB — LIPID PANEL
Chol/HDL Ratio: 2.7 ratio (ref 0.0–5.0)
Cholesterol, Total: 153 mg/dL (ref 100–199)
HDL: 56 mg/dL (ref >39)
LDL Chol Calc (NIH): 84 mg/dL (ref 0–99)
Triglycerides: 68 mg/dL (ref 0–149)
VLDL Cholesterol Cal: 13 mg/dL (ref 5–40)

## 2019-09-16 ENCOUNTER — Other Ambulatory Visit: Payer: Self-pay | Admitting: Internal Medicine

## 2019-11-28 ENCOUNTER — Encounter: Payer: Self-pay | Admitting: Internal Medicine

## 2019-12-14 ENCOUNTER — Other Ambulatory Visit: Payer: Self-pay | Admitting: General Practice

## 2019-12-14 MED ORDER — COMBIVENT RESPIMAT 20-100 MCG/ACT IN AERS: INHALATION_SPRAY | RESPIRATORY_TRACT | 1 refills | Status: DC

## 2019-12-14 NOTE — Telephone Encounter (Signed)
RX REFILL COMBIVENT RESPIMAT 20-100 MCG/ACT AERS respimat  PHARMACY [id number] - [place], [place] - [address] Phone:  [phone]  Fax:  [phone]

## 2020-02-05 ENCOUNTER — Ambulatory Visit: Payer: Medicare HMO

## 2020-02-09 ENCOUNTER — Encounter: Payer: Medicare HMO | Admitting: Internal Medicine

## 2020-02-09 NOTE — Progress Notes (Deleted)
    Date:  02/09/2020   Name:  Eric Stanton   DOB:  1951/04/25   MRN:  [id number]   Chief Complaint: No chief complaint on file.  AZIR MUZYKA is a 69 y.o. male who presents today for his Complete Annual Exam. He feels {DESC; WELL/FAIRLY WELL/POORLY:18703}. He reports exercising ***. He reports he is sleeping {DESC; WELL/FAIRLY WELL/POORLY:18703}.   Colonoscopy: 08/2010 normal  Immunization History  Administered Date(s) Administered  . Influenza, High Dose Seasonal PF 05/06/2017, 05/17/2019  . Influenza,inj,Quad PF,6+ Mos 06/02/2018  . Influenza-Unspecified 05/31/2013, 05/17/2014  . MMR 03/10/1999  . Pneumococcal Conjugate-13 02/08/2019  . Tdap 01/19/2018  . Zoster 07/11/2014    Gastroesophageal Reflux  Hyperlipidemia  COPD -   Lab Results  Component Value Date   CREATININE 1.08 02/08/2019   BUN 7 (L) 02/08/2019   NA 139 02/08/2019   K 4.3 02/08/2019   CL 101 02/08/2019   CO2 24 02/08/2019   Lab Results  Component Value Date   CHOL 153 09/06/2019   HDL 56 09/06/2019   LDLCALC 84 09/06/2019   TRIG 68 09/06/2019   CHOLHDL 2.7 09/06/2019   No results found for: TSH No results found for: HGBA1C Lab Results  Component Value Date   WBC 3.8 02/08/2019   HGB 14.6 02/08/2019   HCT 42.2 02/08/2019   MCV 91 02/08/2019   PLT 260 02/08/2019   Lab Results  Component Value Date   ALT 7 02/08/2019   AST 5 02/08/2019   ALKPHOS 87 02/08/2019   BILITOT 0.4 02/08/2019     Review of Systems  Patient Active Problem List   Diagnosis Date Noted  . Mild hyperlipidemia 02/09/2019  . History of esophageal stricture 11/04/2018  . COPD (chronic obstructive pulmonary disease) ([place]) 11/01/2018  . GERD (gastroesophageal reflux disease) 11/01/2018    Allergies  Allergen Reactions  . Codeine Other (See Comments)    Hyperactivity  . Oxycodone-Acetaminophen Other (See Comments)    Hyperactivity    Past Surgical History:  Procedure Laterality Date  . HERNIA REPAIR  2010     Social History   Tobacco Use  . Smoking status: Former Smoker    Packs/day: 2.00    Years: 20.00    Pack years: 40.00    Types: Cigarettes    Quit date: 1985    Years since quitting: 36.4  . Smokeless tobacco: Never Used  Vaping Use  . Vaping Use: Never used  Substance Use Topics  . Alcohol use: Yes    Alcohol/week: 2.0 standard drinks    Types: 2 Standard drinks or equivalent per week  . Drug use: Never     Medication list has been reviewed and updated.  No outpatient medications have been marked as taking for the 02/09/20 encounter (Appointment) with Glean Hess, MD.    [facility] 2/9 Scores 09/06/2019 09/06/2019 02/08/2019 02/01/2019  PHQ - 2 Score 6 0 0 0  PHQ- 9 Score 12 - - -    No flowsheet data found.  BP Readings from Last 3 Encounters:  09/06/19 122/74  07/19/19 124/76  02/08/19 114/72    Physical Exam  Wt Readings from Last 3 Encounters:  09/06/19 167 lb (75.8 kg)  07/19/19 165 lb (74.8 kg)  02/08/19 169 lb (76.7 kg)    There were no vitals taken for this visit.  Assessment and Plan:

## 2020-02-14 ENCOUNTER — Ambulatory Visit (INDEPENDENT_AMBULATORY_CARE_PROVIDER_SITE_OTHER): Payer: Medicare HMO

## 2020-02-14 ENCOUNTER — Other Ambulatory Visit: Payer: Self-pay

## 2020-02-14 VITALS — BP 118/72 | HR 77 | Resp 16 | Ht 70.0 in | Wt 165.2 lb

## 2020-02-14 DIAGNOSIS — Z23 Encounter for immunization: Secondary | ICD-10-CM | POA: Diagnosis not present

## 2020-02-14 DIAGNOSIS — Z0000 Encounter for general adult medical examination without abnormal findings: Secondary | ICD-10-CM | POA: Diagnosis not present

## 2020-02-14 NOTE — Patient Instructions (Signed)
Eric Stanton , Thank you for taking time to come for your Medicare Wellness Visit. I appreciate your ongoing commitment to your health goals. Please review the following plan we discussed and let me know if I can assist you in the future.   Screening recommendations/referrals: Colonoscopy: done 2012. Due 2022. Recommended yearly ophthalmology/optometry visit for glaucoma screening and checkup Recommended yearly dental visit for hygiene and checkup  Vaccinations: Influenza vaccine: done 05/17/19 Pneumococcal vaccine: done today Tdap vaccine: done 01/19/18 Shingles vaccine: Shingrix discussed. Please contact your pharmacy for coverage information.  Covid-19: please bring your vaccination record with you to your next appt.   Advanced directives: Advance directive discussed with you today. Even though you declined this today please call our office should you change your mind and we can give you the proper paperwork for you to fill out.  Conditions/risks identified: Keep up the great work!  Next appointment: Follow up in one year for your annual wellness visit.   Preventive Care 68 Years and Older, Male Preventive care refers to lifestyle choices and visits with your health care provider that can promote health and wellness. What does preventive care include?  A yearly physical exam. This is also called an annual well check.  Dental exams once or twice a year.  Routine eye exams. Ask your health care provider how often you should have your eyes checked.  Personal lifestyle choices, including:  Daily care of your teeth and gums.  Regular physical activity.  Eating a healthy diet.  Avoiding tobacco and drug use.  Limiting alcohol use.  Practicing safe sex.  Taking low doses of aspirin every day.  Taking vitamin and mineral supplements as recommended by your health care provider. What happens during an annual well check? The services and screenings done by your health care provider  during your annual well check will depend on your age, overall health, lifestyle risk factors, and family history of disease. Counseling  Your health care provider may ask you questions about your:  Alcohol use.  Tobacco use.  Drug use.  Emotional well-being.  Home and relationship well-being.  Sexual activity.  Eating habits.  History of falls.  Memory and ability to understand (cognition).  Work and work Astronomer. Screening  You may have the following tests or measurements:  Height, weight, and BMI.  Blood pressure.  Lipid and cholesterol levels. These may be checked every 5 years, or more frequently if you are over 85 years old.  Skin check.  Lung cancer screening. You may have this screening every year starting at age 35 if you have a 30-pack-year history of smoking and currently smoke or have quit within the past 15 years.  Fecal occult blood test (FOBT) of the stool. You may have this test every year starting at age 28.  Flexible sigmoidoscopy or colonoscopy. You may have a sigmoidoscopy every 5 years or a colonoscopy every 10 years starting at age 42.  Prostate cancer screening. Recommendations will vary depending on your family history and other risks.  Hepatitis C blood test.  Hepatitis B blood test.  Sexually transmitted disease (STD) testing.  Diabetes screening. This is done by checking your blood sugar (glucose) after you have not eaten for a while (fasting). You may have this done every 1-3 years.  Abdominal aortic aneurysm (AAA) screening. You may need this if you are a current or former smoker.  Osteoporosis. You may be screened starting at age 85 if you are at high risk. Talk with your  health care provider about your test results, treatment options, and if necessary, the need for more tests. Vaccines  Your health care provider may recommend certain vaccines, such as:  Influenza vaccine. This is recommended every year.  Tetanus,  diphtheria, and acellular pertussis (Tdap, Td) vaccine. You may need a Td booster every 10 years.  Zoster vaccine. You may need this after age 58.  Pneumococcal 13-valent conjugate (PCV13) vaccine. One dose is recommended after age 55.  Pneumococcal polysaccharide (PPSV23) vaccine. One dose is recommended after age 17. Talk to your health care provider about which screenings and vaccines you need and how often you need them. This information is not intended to replace advice given to you by your health care provider. Make sure you discuss any questions you have with your health care provider. Document Released: 09/06/2015 Document Revised: 04/29/2016 Document Reviewed: 06/11/2015 Elsevier Interactive Patient Education  2017 [place] Prevention in the Home Falls can cause injuries. They can happen to people of all ages. There are many things you can do to make your home safe and to help prevent falls. What can I do on the outside of my home?  Regularly fix the edges of walkways and driveways and fix any cracks.  Remove anything that might make you trip as you walk through a door, such as a raised step or threshold.  Trim any bushes or trees on the path to your home.  Use bright outdoor lighting.  Clear any walking paths of anything that might make someone trip, such as rocks or tools.  Regularly check to see if handrails are loose or broken. Make sure that both sides of any steps have handrails.  Any raised decks and porches should have guardrails on the edges.  Have any leaves, snow, or ice cleared regularly.  Use sand or salt on walking paths during winter.  Clean up any spills in your garage right away. This includes oil or grease spills. What can I do in the bathroom?  Use night lights.  Install grab bars by the toilet and in the tub and shower. Do not use towel bars as grab bars.  Use non-skid mats or decals in the tub or shower.  If you need to sit down in  the shower, use a plastic, non-slip stool.  Keep the floor dry. Clean up any water that spills on the floor as soon as it happens.  Remove soap buildup in the tub or shower regularly.  Attach bath mats securely with double-sided non-slip rug tape.  Do not have throw rugs and other things on the floor that can make you trip. What can I do in the bedroom?  Use night lights.  Make sure that you have a light by your bed that is easy to reach.  Do not use any sheets or blankets that are too big for your bed. They should not hang down onto the floor.  Have a firm chair that has side arms. You can use this for support while you get dressed.  Do not have throw rugs and other things on the floor that can make you trip. What can I do in the kitchen?  Clean up any spills right away.  Avoid walking on wet floors.  Keep items that you use a lot in easy-to-reach places.  If you need to reach something above you, use a strong step stool that has a grab bar.  Keep electrical cords out of the way.  Do not use  floor polish or wax that makes floors slippery. If you must use wax, use non-skid floor wax.  Do not have throw rugs and other things on the floor that can make you trip. What can I do with my stairs?  Do not leave any items on the stairs.  Make sure that there are handrails on both sides of the stairs and use them. Fix handrails that are broken or loose. Make sure that handrails are as long as the stairways.  Check any carpeting to make sure that it is firmly attached to the stairs. Fix any carpet that is loose or worn.  Avoid having throw rugs at the top or bottom of the stairs. If you do have throw rugs, attach them to the floor with carpet tape.  Make sure that you have a light switch at the top of the stairs and the bottom of the stairs. If you do not have them, ask someone to add them for you. What else can I do to help prevent falls?  Wear shoes that:  Do not have high  heels.  Have rubber bottoms.  Are comfortable and fit you well.  Are closed at the toe. Do not wear sandals.  If you use a stepladder:  Make sure that it is fully opened. Do not climb a closed stepladder.  Make sure that both sides of the stepladder are locked into place.  Ask someone to hold it for you, if possible.  Clearly mark and make sure that you can see:  Any grab bars or handrails.  First and last steps.  Where the edge of each step is.  Use tools that help you move around (mobility aids) if they are needed. These include:  Canes.  Walkers.  Scooters.  Crutches.  Turn on the lights when you go into a dark area. Replace any light bulbs as soon as they burn out.  Set up your furniture so you have a clear path. Avoid moving your furniture around.  If any of your floors are uneven, fix them.  If there are any pets around you, be aware of where they are.  Review your medicines with your doctor. Some medicines can make you feel dizzy. This can increase your chance of falling. Ask your doctor what other things that you can do to help prevent falls. This information is not intended to replace advice given to you by your health care provider. Make sure you discuss any questions you have with your health care provider. Document Released: 06/06/2009 Document Revised: 01/16/2016 Document Reviewed: 09/14/2014 Elsevier Interactive Patient Education  2017 [employer].

## 2020-02-14 NOTE — Progress Notes (Signed)
Subjective:   Eric Stanton is a 69 y.o. male who presents for Medicare Annual/Subsequent preventive examination.  Review of Systems     Cardiac Risk Factors include: advanced age (>4mn, >>48women);male gender     Objective:    Today's Vitals   02/14/20 0846  BP: 118/72  Pulse: 77  Resp: 16  SpO2: 99%  Weight: 165 lb 3.2 oz (74.9 kg)  Height: '5\' 10"'  (1.778 m)   Body mass index is 23.7 kg/m.  Advanced Directives 02/14/2020 07/19/2019 02/01/2019  Does Patient Have a Medical Advance Directive? No No No  Would patient like information on creating a medical advance directive? No - Patient declined - Yes (MAU/Ambulatory/Procedural Areas - Information given)    Current Medications (verified) Outpatient Encounter Medications as of 02/14/2020  Medication Sig  . aspirin EC 81 MG tablet Take by mouth.  . docusate sodium (COLACE) 100 MG capsule Take by mouth.  . ezetimibe (ZETIA) 10 MG tablet Take 1 tablet (10 mg total) by mouth daily.  . famotidine (PEPCID) 20 MG tablet Take 20 mg by mouth 2 (two) times daily.  . Ipratropium-Albuterol (COMBIVENT RESPIMAT) 20-100 MCG/ACT AERS respimat 1 INHALATION INTO THE LUNGS 4 TIMES DAILY AS NEEDED FOR WHEEZING  . Multiple Vitamins-Minerals (PRESERVISION AREDS 2+MULTI VIT PO) Take by mouth.  . Omega-3 Fatty Acids (FISH OIL) 1000 MG CAPS Take by mouth.  . Saw Palmetto 1000 MG CAPS Take by mouth.  . vitamin B-12 (CYANOCOBALAMIN) 1000 MCG tablet Take 1,000 mcg by mouth daily.   No facility-administered encounter medications on file as of 02/14/2020.    Allergies (verified) Codeine and Oxycodone-acetaminophen   History: Past Medical History:  Diagnosis Date  . Colon polyps   . [place]   . Diverticulosis   . GERD (gastroesophageal reflux disease)    Past Surgical History:  Procedure Laterality Date  . HERNIA REPAIR  2010   Family History  Problem Relation Age of Onset  . Liver cancer Mother     . Encopresis Father   . Cancer Maternal Aunt   . Cancer Maternal Uncle    Social History   Socioeconomic History  . Marital status: Married    Spouse name: Not on file  . Number of children: 5  . Years of education: Not on file  . Highest education level: 10th grade  Occupational History  . Occupation: retired  Tobacco Use  . Smoking status: Former Smoker    Packs/day: 2.00    Years: 20.00    Pack years: 40.00    Types: Cigarettes    Quit date: 1985    Years since quitting: 36.4  . Smokeless tobacco: Never Used  Vaping Use  . Vaping Use: Never used  Substance and Sexual Activity  . Alcohol use: Not Currently  . Drug use: Never  . Sexual activity: Yes  Other Topics Concern  . Not on file  Social History Narrative  . Not on file   Social Determinants of Health   Financial Resource Strain: Low Risk   . Difficulty of Paying Living Expenses: Not very hard  Food Insecurity: No Food Insecurity  . Worried About RCharity fundraiserin the Last Year: Never true  . Ran Out of Food in the Last Year: Never true  Transportation Needs: No Transportation Needs  . Lack of Transportation (Medical): No  . Lack of Transportation (Non-Medical): No  Physical Activity: Inactive  . Days of Exercise per Week: 0 days  .  Minutes of Exercise per Session: 0 min  Stress: No Stress Concern Present  . Feeling of Stress : Only a little  Social Connections: Moderately Integrated  . Frequency of Communication with Friends and Family: More than three times a week  . Frequency of Social Gatherings with Friends and Family: Three times a week  . Attends Religious Services: More than 4 times per year  . Active Member of Clubs or Organizations: No  . Attends Archivist Meetings: Never  . Marital Status: Married    Tobacco Counseling Counseling given: Not Answered   Clinical Intake:  Pre-visit preparation completed: Yes  Pain : No/denies pain     BMI - recorded:  23.7 Nutritional Status: BMI of 19-24  Normal Nutritional Risks: None Diabetes: No  How often do you need to have someone help you when you read instructions, pamphlets, or other written materials from your doctor or pharmacy?: 1 - Never    Interpreter Needed?: No  Information entered by :: Clemetine Marker LPN   Activities of Daily Living In your present state of health, do you have any difficulty performing the following activities: 02/14/2020  Hearing? Y  Comment declines hearing aids  Vision? N  Difficulty concentrating or making decisions? N  Walking or climbing stairs? N  Dressing or bathing? N  Doing errands, shopping? N  Preparing Food and eating ? N  Using the Toilet? N  In the past six months, have you accidently leaked urine? N  Do you have problems with loss of bowel control? N  Managing your Medications? N  Managing your Finances? N  Housekeeping or managing your Housekeeping? N  Some recent data might be hidden    Patient Care Team: Glean Hess, MD as PCP - General (Internal Medicine) Manya Silvas, MD (Inactive) (Gastroenterology)  Indicate any recent Medical Services you may have received from other than [facility] providers in the past year (date may be approximate).     Assessment:   This is a routine wellness examination for Eric Stanton.  Hearing/Vision screen  Hearing Screening   '125Hz'  '250Hz'  '500Hz'  '1000Hz'  '2000Hz'  '3000Hz'  '4000Hz'  '6000Hz'  '8000Hz'   Right ear:           Left ear:           Comments: Pt c/o mild hearing difficulty, declines hearing aids. 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 Wyatt Portela at [place] issues and exercise activities discussed: Current Exercise Habits: The patient does not participate in regular exercise at present, Exercise limited by: orthopedic condition(s)  Goals    . Patient Stated     Patient states he would like to stay active and healthy       Depression Screen PHQ 2/9 Scores 02/14/2020 09/06/2019 09/06/2019 02/08/2019 02/01/2019 11/04/2018  PHQ - 2 Score 2 6 0 0 0 0  PHQ- 9 Score 5 12 - - - -    Fall Risk Fall Risk  02/14/2020 02/08/2019 02/01/2019 11/04/2018  Falls in the past year? 0 0 0 1  Number falls in past yr: 0 0 0 1  Injury with Fall? 0 0 0 1  Comment - - - shoulder sore/ ply wood slide out from under foot  Risk for fall due to : No Fall Risks - - History of fall(s)  Follow up Falls prevention discussed Falls evaluation completed Falls prevention discussed Falls evaluation completed;Falls prevention discussed    Any stairs  in or around the home? No  If so, are there any without handrails? No  Home free of loose throw rugs in walkways, pet beds, electrical cords, etc? Yes  Adequate lighting in your home to reduce risk of falls? Yes   ASSISTIVE DEVICES UTILIZED TO PREVENT FALLS:  Life alert? No  Use of a cane, walker or w/c? No  Grab bars in the bathroom? Yes  Shower chair or bench in shower? No  Elevated toilet seat or a handicapped toilet? No   TIMED UP AND GO:  Was the test performed? Yes .  Length of time to ambulate 10 feet: 5 sec.   Gait steady and fast without use of assistive device  Cognitive Function:     6CIT Screen 02/14/2020 02/01/2019  What Year? 0 points 0 points  What month? 0 points 0 points  What time? 0 points 0 points  Count back from 20 0 points 0 points  Months in reverse 0 points 2 points  Repeat phrase 0 points 2 points  Total Score 0 4    Immunizations Immunization History  Administered Date(s) Administered  . Influenza, High Dose Seasonal PF 05/06/2017, 05/17/2019  . Influenza,inj,Quad PF,6+ Mos 06/02/2018  . Influenza-Unspecified 05/31/2013, 05/17/2014  . MMR 03/10/1999  . Pneumococcal Conjugate-13 02/08/2019  . Pneumococcal Polysaccharide-23 02/14/2020  . Tdap 01/19/2018  . Zoster 07/11/2014    TDAP status: Up to date Flu Vaccine status: Up to date Pneumococcal  vaccine status: Completed during today's visit. Covid-19 vaccine status: Completed vaccines per patient. Advised pt to bring vaccination record to next appt.   Qualifies for Shingles Vaccine? Yes   Zostavax completed Yes   Shingrix Completed?: No.    Education has been provided regarding the importance of this vaccine. Patient has been advised to call insurance company to determine out of pocket expense if they have not yet received this vaccine. Advised may also receive vaccine at local pharmacy or Health Dept. Verbalized acceptance and understanding.  Screening Tests Health Maintenance  Topic Date Due  . COVID-19 Vaccine (1) Never done  . INFLUENZA VACCINE  03/24/2020  . COLONOSCOPY  08/25/2020  . TETANUS/TDAP  01/20/2028  . Hepatitis C Screening  Completed  . PNA vac Low Risk Adult  Completed    Health Maintenance  Health Maintenance Due  Topic Date Due  . COVID-19 Vaccine (1) Never done    Colorectal cancer screening: Completed 2012. Repeat every 10 years  Lung Cancer Screening: (Low Dose CT Chest recommended if Age 65-80 years, 30 pack-year currently smoking OR have quit w/in 15years.) does not qualify.   Additional Screening:  Hepatitis C Screening: does qualify; Completed 02/08/19  Vision Screening: Recommended annual ophthalmology exams for early detection of glaucoma and other disorders of the eye. Is the patient up to date with their annual eye exam?  Yes  Who is the provider or what is the name of the office in which the patient attends annual eye exams? Dr. Wyatt Portela  Dental Screening: Recommended annual dental exams for proper oral hygiene  Community Resource Referral / Chronic Care Management: CRR required this visit?  No   CCM required this visit?  No      Plan:     I have personally reviewed and noted the following in the patient's chart:   . Medical and social history . Use of alcohol, tobacco or illicit drugs  . Current medications and  supplements . Functional ability and status . Nutritional status . Physical activity . Advanced  directives . List of other physicians . Hospitalizations, surgeries, and ER visits in previous 12 months . Vitals . Screenings to include cognitive, depression, and falls . Referrals and appointments  In addition, I have reviewed and discussed with patient certain preventive protocols, quality metrics, and best practice recommendations. A written personalized care plan for preventive services as well as general preventive health recommendations were provided to patient.     Clemetine Marker, LPN   04/27/7532   Nurse Notes: pt states cost of combivent inhaler is approx $100 per inhaler but he declined offer for CCM pharmacist for possible patient assistance stating he would not qualify. Pt plans to discuss at next visit and/or see if ins plan has preferred inhaler that would be less costly.

## 2020-03-07 ENCOUNTER — Encounter: Payer: Self-pay | Admitting: Internal Medicine

## 2020-03-07 ENCOUNTER — Other Ambulatory Visit: Payer: Self-pay

## 2020-03-07 ENCOUNTER — Ambulatory Visit (INDEPENDENT_AMBULATORY_CARE_PROVIDER_SITE_OTHER): Payer: Medicare HMO | Admitting: Internal Medicine

## 2020-03-07 VITALS — BP 122/82 | HR 80 | Temp 98.0°F | Ht 70.0 in | Wt 164.0 lb

## 2020-03-07 DIAGNOSIS — Z0000 Encounter for general adult medical examination without abnormal findings: Secondary | ICD-10-CM | POA: Diagnosis not present

## 2020-03-07 DIAGNOSIS — K2100 Gastro-esophageal reflux disease with esophagitis, without bleeding: Secondary | ICD-10-CM | POA: Diagnosis not present

## 2020-03-07 DIAGNOSIS — E785 Hyperlipidemia, unspecified: Secondary | ICD-10-CM

## 2020-03-07 DIAGNOSIS — J449 Chronic obstructive pulmonary disease, unspecified: Secondary | ICD-10-CM

## 2020-03-07 DIAGNOSIS — Z125 Encounter for screening for malignant neoplasm of prostate: Secondary | ICD-10-CM

## 2020-03-07 LAB — POCT URINALYSIS DIPSTICK
Bilirubin, UA: NEGATIVE
Blood, UA: NEGATIVE
Glucose, UA: NEGATIVE
Ketones, UA: NEGATIVE
Leukocytes, UA: NEGATIVE
Nitrite, UA: NEGATIVE
Protein, UA: NEGATIVE
Spec Grav, UA: <=1.005 — AB (ref 1.010–1.025)
Urobilinogen, UA: 0.2 E.U./dL
pH, UA: 6.5 (ref 5.0–8.0)

## 2020-03-07 MED ORDER — FLUTICASONE-SALMETEROL 230-21 MCG/ACT IN AERO: 2.0000 | INHALATION_SPRAY | Freq: Two times a day (BID) | RESPIRATORY_TRACT | 12 refills | Status: AC

## 2020-03-07 MED ORDER — ATORVASTATIN CALCIUM 10 MG PO TABS: 10.0000 mg | ORAL_TABLET | Freq: Every day | ORAL | 3 refills | Status: DC

## 2020-03-07 MED ORDER — COMBIVENT RESPIMAT 20-100 MCG/ACT IN AERS: INHALATION_SPRAY | RESPIRATORY_TRACT | 12 refills | Status: DC

## 2020-03-07 NOTE — Progress Notes (Signed)
Date:  03/07/2020   Name:  Eric Stanton   DOB:  Apr 02, 1951   MRN:  [id number]   Chief Complaint: Annual Exam (stop taking zetia (cost))  Eric Stanton is a 69 y.o. male who presents today for his Complete Annual Exam. He feels fairly well. He reports exercising none. He reports he is sleeping poorly - chronic condition.   Colonoscopy: 08/2010  Immunization History  Administered Date(s) Administered  . Influenza, High Dose Seasonal PF 05/06/2017, 05/17/2019  . Influenza,inj,Quad PF,6+ Mos 06/02/2018  . Influenza-Unspecified 05/31/2013, 05/17/2014  . MMR 03/10/1999  . PFIZER SARS-COV-2 Vaccination 11/22/2019, 12/12/2019  . Pneumococcal Conjugate-13 02/08/2019  . Pneumococcal Polysaccharide-23 02/14/2020  . Tdap 01/19/2018  . Zoster 07/11/2014    Gastroesophageal Reflux He complains of heartburn. He reports no abdominal pain, no chest pain, no choking or no wheezing. This is a recurrent problem. The problem occurs rarely. Pertinent negatives include no fatigue. He has tried a histamine-2 antagonist for the symptoms. The treatment provided significant relief.  Hyperlipidemia This is a chronic problem. The problem is uncontrolled. Associated symptoms include shortness of breath (COPD- sometimes). Pertinent negatives include no chest pain or myalgias. Treatments tried: stopped zetia due to cost; willing to try lipitor.  COPD - stable SOB; he uses combivent MDI as needed.  He is supposed to be checking on the cost of several alternatives.  He would like to try another inhaler to see if it is affordable.   Lab Results  Component Value Date   CREATININE 1.08 02/08/2019   BUN 7 (L) 02/08/2019   NA 139 02/08/2019   K 4.3 02/08/2019   CL 101 02/08/2019   CO2 24 02/08/2019   Lab Results  Component Value Date   CHOL 153 09/06/2019   HDL 56 09/06/2019   LDLCALC 84 09/06/2019   TRIG 68 09/06/2019   CHOLHDL 2.7 09/06/2019   No results found for: TSH No results found for: HGBA1C Lab  Results  Component Value Date   WBC 3.8 02/08/2019   HGB 14.6 02/08/2019   HCT 42.2 02/08/2019   MCV 91 02/08/2019   PLT 260 02/08/2019   Lab Results  Component Value Date   ALT 7 02/08/2019   AST 5 02/08/2019   ALKPHOS 87 02/08/2019   BILITOT 0.4 02/08/2019     Review of Systems  Constitutional: Negative for appetite change, chills, diaphoresis, fatigue and unexpected weight change.  HENT: Positive for hearing loss and tinnitus (everyday ). Negative for trouble swallowing and voice change.   Eyes: Negative for visual disturbance.  Respiratory: Positive for shortness of breath (COPD- sometimes). Negative for choking and wheezing.   Cardiovascular: Negative for chest pain, palpitations and leg swelling.  Gastrointestinal: Positive for constipation (occasionally) and heartburn. Negative for abdominal pain, blood in stool and diarrhea.  Genitourinary: Negative for difficulty urinating, dysuria and frequency.  Musculoskeletal: Negative for arthralgias, back pain and myalgias.  Skin: Negative for color change and rash.  Neurological: Negative for dizziness, syncope and headaches.  Hematological: Negative for adenopathy.  Psychiatric/Behavioral: Negative for dysphoric mood and sleep disturbance.    Patient Active Problem List   Diagnosis Date Noted  . Mild hyperlipidemia 02/09/2019  . History of esophageal stricture 11/04/2018  . COPD (chronic obstructive pulmonary disease) ([place]) 11/01/2018  . GERD (gastroesophageal reflux disease) 11/01/2018    Allergies  Allergen Reactions  . Codeine Other (See Comments)    Hyperactivity  . Oxycodone-Acetaminophen Other (See Comments)    Hyperactivity  Past Surgical History:  Procedure Laterality Date  . HERNIA REPAIR  2010    Social History   Tobacco Use  . Smoking status: Former Smoker    Packs/day: 2.00    Years: 20.00    Pack years: 40.00    Types: Cigarettes    Quit date: 1985    Years since quitting: 36.5  .  Smokeless tobacco: Never Used  Vaping Use  . Vaping Use: Never used  Substance Use Topics  . Alcohol use: Not Currently  . Drug use: Never     Medication list has been reviewed and updated.  Current Meds  Medication Sig  . aspirin EC 81 MG tablet Take by mouth.  . docusate sodium (COLACE) 100 MG capsule Take by mouth.  . famotidine (PEPCID) 20 MG tablet Take 20 mg by mouth daily.   . Ipratropium-Albuterol (COMBIVENT RESPIMAT) 20-100 MCG/ACT AERS respimat 1 INHALATION INTO THE LUNGS 4 TIMES DAILY AS NEEDED FOR WHEEZING  . Multiple Vitamins-Minerals (PRESERVISION AREDS 2+MULTI VIT PO) Take by mouth.  . Omega-3 Fatty Acids (FISH OIL) 1000 MG CAPS Take by mouth.  . Saw Palmetto 1000 MG CAPS Take by mouth.  . vitamin B-12 (CYANOCOBALAMIN) 1000 MCG tablet Take 1,000 mcg by mouth daily.    PHQ 2/9 Scores 03/07/2020 02/14/2020 09/06/2019 09/06/2019  PHQ - 2 Score 0 2 6 0  PHQ- 9 Score '2 5 12 ' -    GAD 7 : Generalized Anxiety Score 03/07/2020  Nervous, Anxious, on Edge 0  Control/stop worrying 0  Worry too much - different things 0  Trouble relaxing 0  Restless 0  Easily annoyed or irritable 0  Afraid - awful might happen 0  Total GAD 7 Score 0  Anxiety Difficulty Not difficult at all    BP Readings from Last 3 Encounters:  03/07/20 122/82  02/14/20 118/72  09/06/19 122/74    Physical Exam Vitals and nursing note reviewed.  Constitutional:      Appearance: Normal appearance. He is well-developed.  HENT:     Head: Normocephalic.     Right Ear: Tympanic membrane, ear canal and external ear normal.     Left Ear: Tympanic membrane, ear canal and external ear normal.     Nose: Nose normal.     Mouth/Throat:     Pharynx: Uvula midline.  Eyes:     Conjunctiva/sclera: Conjunctivae normal.     Pupils: Pupils are equal, round, and reactive to light.  Neck:     Thyroid: No thyromegaly.     Vascular: No carotid bruit.  Cardiovascular:     Rate and Rhythm: Normal rate and regular  rhythm.     Heart sounds: Normal heart sounds.  Pulmonary:     Effort: Pulmonary effort is normal.     Breath sounds: Normal air entry. Examination of the left-lower field reveals wheezing. Wheezing present.  Chest:     Breasts:        Right: No mass.        Left: No mass.  Abdominal:     General: Bowel sounds are normal.     Palpations: Abdomen is soft.     Tenderness: There is no abdominal tenderness.  Musculoskeletal:        General: Normal range of motion.     Right shoulder: Deformity present.     Cervical back: Normal range of motion and neck supple.  Lymphadenopathy:     Cervical: No cervical adenopathy.  Skin:    General: Skin is  warm and dry.  Neurological:     Mental Status: He is alert and oriented to person, place, and time.     Deep Tendon Reflexes: Reflexes are normal and symmetric.  Psychiatric:        Speech: Speech normal.        Behavior: Behavior normal.        Thought Content: Thought content normal.        Judgment: Judgment normal.     Wt Readings from Last 3 Encounters:  03/07/20 164 lb (74.4 kg)  02/14/20 165 lb 3.2 oz (74.9 kg)  09/06/19 167 lb (75.8 kg)    BP 122/82   Pulse 80   Temp 98 F (36.7 C) (Oral)   Ht '5\' 10"'  (1.778 m)   Wt 164 lb (74.4 kg)   SpO2 97%   BMI 23.53 kg/m   Assessment and Plan: 1. Annual physical exam Normal exam Colonoscopy due next year Need to resume regular exercise - POCT urinalysis dipstick  2. Gastroesophageal reflux disease with esophagitis without hemorrhage Symptoms well controlled on daily PPI No red flag signs such as weight loss, n/v, melena Will continue pepcid. - CBC with Differential/Platelet  3. Mild hyperlipidemia He has stopped Zetia.  He is willing to try a statin. - Comprehensive metabolic panel - Lipid panel - atorvastatin (LIPITOR) 10 MG tablet; Take 1 tablet (10 mg total) by mouth daily.  Dispense: 90 tablet; Refill: 3  4. Chronic obstructive pulmonary disease, unspecified COPD  type ([place]) Continue combivent as needed Will send Rx for Advair - Ipratropium-Albuterol (COMBIVENT RESPIMAT) 20-100 MCG/ACT AERS respimat; 1 INHALATION INTO THE LUNGS 4 TIMES DAILY AS NEEDED FOR WHEEZING  Dispense: 12 g; Refill: 12 - fluticasone-salmeterol (ADVAIR HFA) 230-21 MCG/ACT inhaler; Inhale 2 puffs into the lungs 2 (two) times daily.  Dispense: 1 Inhaler; Refill: 12  5. Prostate cancer screening DRE deferred - PSA   Partially dictated using Dragon software. Any errors are unintentional.  Halina Maidens, MD [facility]  03/07/2020

## 2020-03-08 LAB — CBC WITH DIFFERENTIAL/PLATELET
Basophils Absolute: 0 10*3/uL (ref 0.0–0.2)
Basos: 1 %
EOS (ABSOLUTE): 0.4 10*3/uL (ref 0.0–0.4)
Eos: 14 %
Hematocrit: 38.6 % (ref 37.5–51.0)
Hemoglobin: 13.7 g/dL (ref 13.0–17.7)
Immature Grans (Abs): 0 10*3/uL (ref 0.0–0.1)
Immature Granulocytes: 0 %
Lymphocytes Absolute: 0.9 10*3/uL (ref 0.7–3.1)
Lymphs: 32 %
MCH: 32.9 pg (ref 26.6–33.0)
MCHC: 35.5 g/dL (ref 31.5–35.7)
MCV: 93 fL (ref 79–97)
Monocytes Absolute: 0.2 10*3/uL (ref 0.1–0.9)
Monocytes: 7 %
Neutrophils Absolute: 1.3 10*3/uL — ABNORMAL LOW (ref 1.4–7.0)
Neutrophils: 46 %
Platelets: 208 10*3/uL (ref 150–450)
RBC: 4.17 x10E6/uL (ref 4.14–5.80)
RDW: 12.1 % (ref 11.6–15.4)
WBC: 2.8 10*3/uL — ABNORMAL LOW (ref 3.4–10.8)

## 2020-03-08 LAB — LIPID PANEL
Chol/HDL Ratio: 3.4 ratio (ref 0.0–5.0)
Cholesterol, Total: 151 mg/dL (ref 100–199)
HDL: 45 mg/dL (ref >39)
LDL Chol Calc (NIH): 91 mg/dL (ref 0–99)
Triglycerides: 76 mg/dL (ref 0–149)
VLDL Cholesterol Cal: 15 mg/dL (ref 5–40)

## 2020-03-08 LAB — COMPREHENSIVE METABOLIC PANEL
ALT: 7 IU/L (ref 0–44)
AST: 8 IU/L (ref 0–40)
Albumin/Globulin Ratio: 1.6 (ref 1.2–2.2)
Albumin: 4.1 g/dL (ref 3.8–4.8)
Alkaline Phosphatase: 92 IU/L (ref 48–121)
BUN/Creatinine Ratio: 8 — ABNORMAL LOW (ref 10–24)
BUN: 8 mg/dL (ref 8–27)
Bilirubin Total: 0.4 mg/dL (ref 0.0–1.2)
CO2: 26 mmol/L (ref 20–29)
Calcium: 9.2 mg/dL (ref 8.6–10.2)
Chloride: 103 mmol/L (ref 96–106)
Creatinine, Ser: 1.05 mg/dL (ref 0.76–1.27)
GFR calc Af Amer: 84 mL/min/{1.73_m2} (ref >59)
GFR calc non Af Amer: 73 mL/min/{1.73_m2} (ref >59)
Globulin, Total: 2.5 g/dL (ref 1.5–4.5)
Glucose: 101 mg/dL — ABNORMAL HIGH (ref 65–99)
Potassium: 4.2 mmol/L (ref 3.5–5.2)
Sodium: 141 mmol/L (ref 134–144)
Total Protein: 6.6 g/dL (ref 6.0–8.5)

## 2020-03-08 LAB — PSA: Prostate Specific Ag, Serum: 0.4 ng/mL (ref 0.0–4.0)

## 2020-04-07 ENCOUNTER — Other Ambulatory Visit: Payer: Self-pay | Admitting: Internal Medicine

## 2020-04-07 DIAGNOSIS — D708 Other neutropenia: Secondary | ICD-10-CM | POA: Insufficient documentation

## 2020-04-07 DIAGNOSIS — D72819 Decreased white blood cell count, unspecified: Secondary | ICD-10-CM

## 2020-04-08 ENCOUNTER — Other Ambulatory Visit: Payer: Self-pay

## 2020-04-08 ENCOUNTER — Other Ambulatory Visit: Payer: Medicare HMO

## 2020-04-09 LAB — CBC WITH DIFFERENTIAL/PLATELET
Basophils Absolute: 0 10*3/uL (ref 0.0–0.2)
Basos: 1 %
EOS (ABSOLUTE): 0.3 10*3/uL (ref 0.0–0.4)
Eos: 10 %
Hematocrit: 37.9 % (ref 37.5–51.0)
Hemoglobin: 13 g/dL (ref 13.0–17.7)
Immature Grans (Abs): 0 10*3/uL (ref 0.0–0.1)
Immature Granulocytes: 0 %
Lymphocytes Absolute: 0.9 10*3/uL (ref 0.7–3.1)
Lymphs: 31 %
MCH: 31.9 pg (ref 26.6–33.0)
MCHC: 34.3 g/dL (ref 31.5–35.7)
MCV: 93 fL (ref 79–97)
Monocytes Absolute: 0.2 10*3/uL (ref 0.1–0.9)
Monocytes: 7 %
Neutrophils Absolute: 1.4 10*3/uL (ref 1.4–7.0)
Neutrophils: 51 %
Platelets: 183 10*3/uL (ref 150–450)
RBC: 4.07 x10E6/uL — ABNORMAL LOW (ref 4.14–5.80)
RDW: 12.8 % (ref 11.6–15.4)
WBC: 2.8 10*3/uL — ABNORMAL LOW (ref 3.4–10.8)

## 2020-09-10 ENCOUNTER — Ambulatory Visit: Payer: Medicare HMO | Admitting: Internal Medicine

## 2020-09-24 ENCOUNTER — Other Ambulatory Visit: Payer: Self-pay

## 2020-09-24 ENCOUNTER — Ambulatory Visit (INDEPENDENT_AMBULATORY_CARE_PROVIDER_SITE_OTHER): Payer: Medicare HMO | Admitting: Internal Medicine

## 2020-09-24 ENCOUNTER — Encounter: Payer: Self-pay | Admitting: Internal Medicine

## 2020-09-24 VITALS — BP 132/84 | HR 89 | Temp 97.4°F | Ht 70.0 in | Wt 175.0 lb

## 2020-09-24 DIAGNOSIS — J449 Chronic obstructive pulmonary disease, unspecified: Secondary | ICD-10-CM

## 2020-09-24 DIAGNOSIS — H539 Unspecified visual disturbance: Secondary | ICD-10-CM

## 2020-09-24 DIAGNOSIS — E785 Hyperlipidemia, unspecified: Secondary | ICD-10-CM

## 2020-09-24 NOTE — Progress Notes (Signed)
Date:  09/24/2020   Name:  Eric Stanton   DOB:  1951/02/08   MRN:  [id number]   Chief Complaint: COPD and Gastroesophageal Reflux  Hyperlipidemia This is a chronic problem. Pertinent negatives include no chest pain or shortness of breath. Current antihyperlipidemic treatment includes statins (started last visit).   COPD - started on Advair last visit for daily use; continued combivent PRN.  He feels well and has no limitations to activities - just has to slow down.  No recent chest infections, increase in sputum or wheezing.  He has received 2 covid vaccines - no booster.  Vision change - gray area in right eye for the past month.  No pain or trauma.  Has not yet scheduled with eye MD.  Lab Results  Component Value Date   CREATININE 1.05 03/07/2020   BUN 8 03/07/2020   NA 141 03/07/2020   K 4.2 03/07/2020   CL 103 03/07/2020   CO2 26 03/07/2020   Lab Results  Component Value Date   CHOL 151 03/07/2020   HDL 45 03/07/2020   LDLCALC 91 03/07/2020   TRIG 76 03/07/2020   CHOLHDL 3.4 03/07/2020   No results found for: TSH No results found for: HGBA1C Lab Results  Component Value Date   WBC 2.8 (L) 04/08/2020   HGB 13.0 04/08/2020   HCT 37.9 04/08/2020   MCV 93 04/08/2020   PLT 183 04/08/2020   Lab Results  Component Value Date   ALT 7 03/07/2020   AST 8 03/07/2020   ALKPHOS 92 03/07/2020   BILITOT 0.4 03/07/2020     Review of Systems  Constitutional: Negative for appetite change, fatigue and unexpected weight change.  Eyes: Positive for visual disturbance. Negative for pain and discharge.  Respiratory: Negative for cough, shortness of breath and wheezing.   Cardiovascular: Negative for chest pain, palpitations and leg swelling.  Gastrointestinal: Negative for abdominal distention, abdominal pain and blood in stool.  Skin: Negative for color change and rash.  Neurological: Negative for tremors, numbness and headaches.  Psychiatric/Behavioral: Negative for  dysphoric mood.    Patient Active Problem List   Diagnosis Date Noted  . Leukopenia 04/07/2020  . Mild hyperlipidemia 02/09/2019  . History of esophageal stricture 11/04/2018  . COPD (chronic obstructive pulmonary disease) ([facility]) 11/01/2018  . GERD (gastroesophageal reflux disease) 11/01/2018    Allergies  Allergen Reactions  . Codeine Other (See Comments)    Hyperactivity  . Oxycodone-Acetaminophen Other (See Comments)    Hyperactivity    Past Surgical History:  Procedure Laterality Date  . HERNIA REPAIR  2010    Social History   Tobacco Use  . Smoking status: Former Smoker    Packs/day: 2.00    Years: 20.00    Pack years: 40.00    Types: Cigarettes    Quit date: 1985    Years since quitting: 37.1  . Smokeless tobacco: Never Used  Vaping Use  . Vaping Use: Never used  Substance Use Topics  . Alcohol use: Not Currently  . Drug use: Never     Medication list has been reviewed and updated.  Current Meds  Medication Sig  . aspirin EC 81 MG tablet Take by mouth.  Marland Kitchen atorvastatin (LIPITOR) 10 MG tablet Take 1 tablet (10 mg total) by mouth daily.  Marland Kitchen docusate sodium (COLACE) 100 MG capsule Take by mouth.  . famotidine (PEPCID) 20 MG tablet Take 20 mg by mouth daily.   . fluticasone-salmeterol (ADVAIR HFA) 230-21 MCG/ACT  inhaler Inhale 2 puffs into the lungs 2 (two) times daily.  . Ipratropium-Albuterol (COMBIVENT RESPIMAT) 20-100 MCG/ACT AERS respimat 1 INHALATION INTO THE LUNGS 4 TIMES DAILY AS NEEDED FOR WHEEZING  . Multiple Vitamins-Minerals (PRESERVISION AREDS 2+MULTI VIT PO) Take by mouth.  . Omega-3 Fatty Acids (FISH OIL) 1000 MG CAPS Take by mouth.  . Saw Palmetto 1000 MG CAPS Take by mouth.  . vitamin B-12 (CYANOCOBALAMIN) 1000 MCG tablet Take 1,000 mcg by mouth daily.    PHQ 2/9 Scores 09/24/2020 03/07/2020 02/14/2020 09/06/2019  PHQ - 2 Score 0 0 2 6  PHQ- 9 Score 0 2 5 12     GAD 7 : Generalized Anxiety Score 09/24/2020 03/07/2020  Nervous, Anxious, on Edge  0 0  Control/stop worrying 0 0  Worry too much - different things 0 0  Trouble relaxing 0 0  Restless 0 0  Easily annoyed or irritable 0 0  Afraid - awful might happen 0 0  Total GAD 7 Score 0 0  Anxiety Difficulty Not difficult at all Not difficult at all    BP Readings from Last 3 Encounters:  09/24/20 132/84  03/07/20 122/82  02/14/20 118/72    Physical Exam Vitals and nursing note reviewed.  Constitutional:      General: He is not in acute distress.    Appearance: He is well-developed.  HENT:     Head: Normocephalic and atraumatic.  Pulmonary:     Effort: Pulmonary effort is normal. No respiratory distress.  Musculoskeletal:        General: Normal range of motion.  Skin:    General: Skin is warm and dry.     Findings: No rash.  Neurological:     Mental Status: He is alert and oriented to person, place, and time.  Psychiatric:        Mood and Affect: Mood and affect normal.        Behavior: Behavior normal.        Thought Content: Thought content normal.     Wt Readings from Last 3 Encounters:  09/24/20 175 lb (79.4 kg)  03/07/20 164 lb (74.4 kg)  02/14/20 165 lb 3.2 oz (74.9 kg)    BP 132/84   Pulse 89   Temp (!) 97.4 F (36.3 C) (Oral)   Ht 5\' 10"  (1.778 m)   Wt 175 lb (79.4 kg)   SpO2 97%   BMI 25.11 kg/m   Assessment and Plan: 1. Chronic obstructive pulmonary disease, unspecified COPD type ([facility]) Stable; doing well on Advair and PRN combivent  2. Mild hyperlipidemia Now on statin therapy and doing well without side effects Will check labs - Lipid panel - Comprehensive metabolic panel  3. Change in vision Recommend appt asap with [facility]  Partially dictated using 02/16/20. Any errors are unintentional.  , MD [facility]  09/24/2020

## 2020-09-24 NOTE — Patient Instructions (Signed)
Call [facility] for a check up.

## 2020-10-02 ENCOUNTER — Telehealth: Payer: Self-pay

## 2020-10-02 NOTE — Telephone Encounter (Signed)
-----   Message from Reubin Milan, MD sent at 09/29/2020  4:12 PM EST ----- Remind him it is important to get his labs done due to his medication.  He should be able to go straight to [facility] but does need to be fasting for better results.  ----- Message ----- From: SYSTEM Sent: 09/29/2020  12:11 AM EST To: Reubin Milan, MD

## 2020-10-02 NOTE — Telephone Encounter (Signed)
Called and reminded patient to get his labs drawn. He said he wasn't aware he was supposed to at his appt otherwise he would have went that day. He said he will go today or tomorrow to get this done.

## 2020-10-04 LAB — COMPREHENSIVE METABOLIC PANEL
ALT: 9 IU/L (ref 0–44)
AST: 9 IU/L (ref 0–40)
Albumin/Globulin Ratio: 2 (ref 1.2–2.2)
Albumin: 4.3 g/dL (ref 3.8–4.8)
Alkaline Phosphatase: 91 IU/L (ref 44–121)
BUN/Creatinine Ratio: 11 (ref 10–24)
BUN: 11 mg/dL (ref 8–27)
Bilirubin Total: 0.6 mg/dL (ref 0.0–1.2)
CO2: 22 mmol/L (ref 20–29)
Calcium: 8.9 mg/dL (ref 8.6–10.2)
Chloride: 101 mmol/L (ref 96–106)
Creatinine, Ser: 1.04 mg/dL (ref 0.76–1.27)
GFR calc Af Amer: 85 mL/min/{1.73_m2} (ref >59)
GFR calc non Af Amer: 73 mL/min/{1.73_m2} (ref >59)
Globulin, Total: 2.2 g/dL (ref 1.5–4.5)
Glucose: 100 mg/dL — ABNORMAL HIGH (ref 65–99)
Potassium: 3.9 mmol/L (ref 3.5–5.2)
Sodium: 139 mmol/L (ref 134–144)
Total Protein: 6.5 g/dL (ref 6.0–8.5)

## 2020-10-04 LAB — LIPID PANEL
Chol/HDL Ratio: 3 ratio (ref 0.0–5.0)
Cholesterol, Total: 138 mg/dL (ref 100–199)
HDL: 46 mg/dL (ref >39)
LDL Chol Calc (NIH): 77 mg/dL (ref 0–99)
Triglycerides: 73 mg/dL (ref 0–149)
VLDL Cholesterol Cal: 15 mg/dL (ref 5–40)

## 2021-02-17 ENCOUNTER — Other Ambulatory Visit: Payer: Self-pay

## 2021-02-17 ENCOUNTER — Ambulatory Visit (INDEPENDENT_AMBULATORY_CARE_PROVIDER_SITE_OTHER): Payer: Medicare HMO

## 2021-02-17 VITALS — BP 110/70 | HR 81 | Temp 98.1°F | Resp 16 | Ht 70.0 in | Wt 166.8 lb

## 2021-02-17 DIAGNOSIS — Z0000 Encounter for general adult medical examination without abnormal findings: Secondary | ICD-10-CM

## 2021-02-17 DIAGNOSIS — J449 Chronic obstructive pulmonary disease, unspecified: Secondary | ICD-10-CM

## 2021-02-17 DIAGNOSIS — Z599 Problem related to housing and economic circumstances, unspecified: Secondary | ICD-10-CM

## 2021-02-17 NOTE — Patient Instructions (Signed)
Eric Stanton , Thank you for taking time to come for your Medicare Wellness Visit. I appreciate your ongoing commitment to your health goals. Please review the following plan we discussed and let me know if I can assist you in the future.   Screening recommendations/referrals: Colonoscopy: done 2012. Due for repeat screening.  Recommended yearly ophthalmology/optometry visit for glaucoma screening and checkup Recommended yearly dental visit for hygiene and checkup  Vaccinations: Influenza vaccine: done 05/07/20 Pneumococcal vaccine: done 02/14/20 Tdap vaccine: done 01/19/18 Shingles vaccine: Shingrix discussed. Please contact your pharmacy for coverage information.  Covid-19:  done 11/22/19 & 12/12/19  Advanced directives: Advance directive discussed with you today. Even though you declined this today please call our office should you change your mind and we can give you the proper paperwork for you to fill out.   Conditions/risks identified: Keep up the great work!  Next appointment: Follow up in one year for your annual wellness visit.   Preventive Care 50 Years and Older, Male Preventive care refers to lifestyle choices and visits with your health care provider that can promote health and wellness. What does preventive care include? A yearly physical exam. This is also called an annual well check. Dental exams once or twice a year. Routine eye exams. Ask your health care provider how often you should have your eyes checked. Personal lifestyle choices, including: Daily care of your teeth and gums. Regular physical activity. Eating a healthy diet. Avoiding tobacco and drug use. Limiting alcohol use. Practicing safe sex. Taking low doses of aspirin every day. Taking vitamin and mineral supplements as recommended by your health care provider. What happens during an annual well check? The services and screenings done by your health care provider during your annual well check will depend on  your age, overall health, lifestyle risk factors, and family history of disease. Counseling  Your health care provider may ask you questions about your: Alcohol use. Tobacco use. Drug use. Emotional well-being. Home and relationship well-being. Sexual activity. Eating habits. History of falls. Memory and ability to understand (cognition). Work and work Astronomer. Screening  You may have the following tests or measurements: Height, weight, and BMI. Blood pressure. Lipid and cholesterol levels. These may be checked every 5 years, or more frequently if you are over 61 years old. Skin check. Lung cancer screening. You may have this screening every year starting at age 66 if you have a 30-pack-year history of smoking and currently smoke or have quit within the past 15 years. Fecal occult blood test (FOBT) of the stool. You may have this test every year starting at age 1. Flexible sigmoidoscopy or colonoscopy. You may have a sigmoidoscopy every 5 years or a colonoscopy every 10 years starting at age 59. Prostate cancer screening. Recommendations will vary depending on your family history and other risks. Hepatitis C blood test. Hepatitis B blood test. Sexually transmitted disease (STD) testing. Diabetes screening. This is done by checking your blood sugar (glucose) after you have not eaten for a while (fasting). You may have this done every 1-3 years. Abdominal aortic aneurysm (AAA) screening. You may need this if you are a current or former smoker. Osteoporosis. You may be screened starting at age 3 if you are at high risk. Talk with your health care provider about your test results, treatment options, and if necessary, the need for more tests. Vaccines  Your health care provider may recommend certain vaccines, such as: Influenza vaccine. This is recommended every year. Tetanus, diphtheria, and  acellular pertussis (Tdap, Td) vaccine. You may need a Td booster every 10 years. Zoster  vaccine. You may need this after age 31. Pneumococcal 13-valent conjugate (PCV13) vaccine. One dose is recommended after age 74. Pneumococcal polysaccharide (PPSV23) vaccine. One dose is recommended after age 11. Talk to your health care provider about which screenings and vaccines you need and how often you need them. This information is not intended to replace advice given to you by your health care provider. Make sure you discuss any questions you have with your health care provider. Document Released: 09/06/2015 Document Revised: 04/29/2016 Document Reviewed: 06/11/2015 Elsevier Interactive Patient Education  2017 [place] Prevention in the Home Falls can cause injuries. They can happen to people of all ages. There are many things you can do to make your home safe and to help prevent falls. What can I do on the outside of my home? Regularly fix the edges of walkways and driveways and fix any cracks. Remove anything that might make you trip as you walk through a door, such as a raised step or threshold. Trim any bushes or trees on the path to your home. Use bright outdoor lighting. Clear any walking paths of anything that might make someone trip, such as rocks or tools. Regularly check to see if handrails are loose or broken. Make sure that both sides of any steps have handrails. Any raised decks and porches should have guardrails on the edges. Have any leaves, snow, or ice cleared regularly. Use sand or salt on walking paths during winter. Clean up any spills in your garage right away. This includes oil or grease spills. What can I do in the bathroom? Use night lights. Install grab bars by the toilet and in the tub and shower. Do not use towel bars as grab bars. Use non-skid mats or decals in the tub or shower. If you need to sit down in the shower, use a plastic, non-slip stool. Keep the floor dry. Clean up any water that spills on the floor as soon as it happens. Remove  soap buildup in the tub or shower regularly. Attach bath mats securely with double-sided non-slip rug tape. Do not have throw rugs and other things on the floor that can make you trip. What can I do in the bedroom? Use night lights. Make sure that you have a light by your bed that is easy to reach. Do not use any sheets or blankets that are too big for your bed. They should not hang down onto the floor. Have a firm chair that has side arms. You can use this for support while you get dressed. Do not have throw rugs and other things on the floor that can make you trip. What can I do in the kitchen? Clean up any spills right away. Avoid walking on wet floors. Keep items that you use a lot in easy-to-reach places. If you need to reach something above you, use a strong step stool that has a grab bar. Keep electrical cords out of the way. Do not use floor polish or wax that makes floors slippery. If you must use wax, use non-skid floor wax. Do not have throw rugs and other things on the floor that can make you trip. What can I do with my stairs? Do not leave any items on the stairs. Make sure that there are handrails on both sides of the stairs and use them. Fix handrails that are broken or loose. Make sure that  handrails are as long as the stairways. Check any carpeting to make sure that it is firmly attached to the stairs. Fix any carpet that is loose or worn. Avoid having throw rugs at the top or bottom of the stairs. If you do have throw rugs, attach them to the floor with carpet tape. Make sure that you have a light switch at the top of the stairs and the bottom of the stairs. If you do not have them, ask someone to add them for you. What else can I do to help prevent falls? Wear shoes that: Do not have high heels. Have rubber bottoms. Are comfortable and fit you well. Are closed at the toe. Do not wear sandals. If you use a stepladder: Make sure that it is fully opened. Do not climb a  closed stepladder. Make sure that both sides of the stepladder are locked into place. Ask someone to hold it for you, if possible. Clearly mark and make sure that you can see: Any grab bars or handrails. First and last steps. Where the edge of each step is. Use tools that help you move around (mobility aids) if they are needed. These include: Canes. Walkers. Scooters. Crutches. Turn on the lights when you go into a dark area. Replace any light bulbs as soon as they burn out. Set up your furniture so you have a clear path. Avoid moving your furniture around. If any of your floors are uneven, fix them. If there are any pets around you, be aware of where they are. Review your medicines with your doctor. Some medicines can make you feel dizzy. This can increase your chance of falling. Ask your doctor what other things that you can do to help prevent falls. This information is not intended to replace advice given to you by your health care provider. Make sure you discuss any questions you have with your health care provider. Document Released: 06/06/2009 Document Revised: 01/16/2016 Document Reviewed: 09/14/2014 Elsevier Interactive Patient Education  2017 [employer].

## 2021-02-17 NOTE — Progress Notes (Signed)
Subjective:   Eric Stanton is a 70 y.o. male who presents for Medicare Annual/Subsequent preventive examination.  Review of Systems     Cardiac Risk Factors include: advanced age (>31mn, >>48women);male gender;dyslipidemia     Objective:    Today's Vitals   02/17/21 0840  BP: 110/70  Pulse: 81  Resp: 16  Temp: 98.1 F (36.7 C)  TempSrc: Oral  SpO2: 96%  Weight: 166 lb 12.8 oz (75.7 kg)  Height: '5\' 10"'  (1.778 m)   Body mass index is 23.93 kg/m.  Advanced Directives 02/17/2021 02/14/2020 07/19/2019 02/01/2019  Does Patient Have a Medical Advance Directive? No No No No  Would patient like information on creating a medical advance directive? No - Patient declined No - Patient declined - Yes (MAU/Ambulatory/Procedural Areas - Information given)    Current Medications (verified) Outpatient Encounter Medications as of 02/17/2021  Medication Sig   aspirin EC 81 MG tablet Take by mouth.   atorvastatin (LIPITOR) 10 MG tablet Take 1 tablet (10 mg total) by mouth daily.   docusate sodium (COLACE) 100 MG capsule Take by mouth.   famotidine (PEPCID) 20 MG tablet Take 20 mg by mouth daily.    fluticasone-salmeterol (ADVAIR HFA) 230-21 MCG/ACT inhaler Inhale 2 puffs into the lungs 2 (two) times daily.   Ipratropium-Albuterol (COMBIVENT RESPIMAT) 20-100 MCG/ACT AERS respimat 1 INHALATION INTO THE LUNGS 4 TIMES DAILY AS NEEDED FOR WHEEZING   Multiple Vitamins-Minerals (PRESERVISION AREDS 2+MULTI VIT PO) Take by mouth.   Omega-3 Fatty Acids (FISH OIL) 1000 MG CAPS Take by mouth.   Saw Palmetto 1000 MG CAPS Take by mouth.   vitamin B-12 (CYANOCOBALAMIN) 1000 MCG tablet Take 1,000 mcg by mouth daily.   No facility-administered encounter medications on file as of 02/17/2021.    Allergies (verified) Codeine and Oxycodone-acetaminophen   History: Past Medical History:  Diagnosis Date   Colon polyps    COPD (chronic obstructive pulmonary disease) (HCC)    Diverticulosis    GERD  (gastroesophageal reflux disease)    Macular degeneration of right eye    Past Surgical History:  Procedure Laterality Date   HERNIA REPAIR  2010   Family History  Problem Relation Age of Onset   Liver cancer Mother    Encopresis Father    Cancer Maternal Aunt    Cancer Maternal Uncle    Social History   Socioeconomic History   Marital status: Married    Spouse name: Not on file   Number of children: 5   Years of education: Not on file   Highest education level: 10th grade  Occupational History   Occupation: retired  Tobacco Use   Smoking status: Former    Packs/day: 2.00    Years: 20.00    Pack years: 40.00    Types: Cigarettes    Quit date: 1985    Years since quitting: 37.5   Smokeless tobacco: Never   Tobacco comments:    Smoking cessation materials not required  Vaping Use   Vaping Use: Never used  Substance and Sexual Activity   Alcohol use: Not Currently   Drug use: Never   Sexual activity: Yes  Other Topics Concern   Not on file  Social History Narrative   Not on file   Social Determinants of Health   Financial Resource Strain: Low Risk    Difficulty of Paying Living Expenses: Not very hard  Food Insecurity: No Food Insecurity   Worried About Running Out of Food in the Last  Year: Never true   [place] in the Last Year: Never true  Transportation Needs: No Transportation Needs   Lack of Transportation (Medical): No   Lack of Transportation (Non-Medical): No  Physical Activity: Inactive   Days of Exercise per Week: 0 days   Minutes of Exercise per Session: 0 min  Stress: No Stress Concern Present   Feeling of Stress : Not at all  Social Connections: Moderately Integrated   Frequency of Communication with Friends and Family: More than three times a week   Frequency of Social Gatherings with Friends and Family: Three times a week   Attends Religious Services: More than 4 times per year   Active Member of Clubs or Organizations: No   Attends  Archivist Meetings: Never   Marital Status: Married    Tobacco Counseling Counseling given: No Tobacco comments: Smoking cessation materials not required   Clinical Intake:  Pre-visit preparation completed: Yes  Pain : No/denies pain     BMI - recorded: 23.93 Nutritional Status: BMI of 19-24  Normal Nutritional Risks: None Diabetes: No  How often do you need to have someone help you when you read instructions, pamphlets, or other written materials from your doctor or pharmacy?: 1 - Never    Interpreter Needed?: No  Information entered by :: Clemetine Marker LPN   Activities of Daily Living In your present state of health, do you have any difficulty performing the following activities: 02/17/2021 09/24/2020  Hearing? Y N  Comment declines hearing aids -  Vision? Y N  Difficulty concentrating or making decisions? N N  Walking or climbing stairs? N N  Dressing or bathing? N N  Doing errands, shopping? N N  Preparing Food and eating ? N -  Using the Toilet? N -  In the past six months, have you accidently leaked urine? N -  Do you have problems with loss of bowel control? N -  Managing your Medications? N -  Managing your Finances? N -  Housekeeping or managing your Housekeeping? N -  Some recent data might be hidden    Patient Care Team: Glean Hess, MD as PCP - General (Internal Medicine)  Indicate any recent Medical Services you may have received from other than [facility] providers in the past year (date may be approximate).     Assessment:   This is a routine wellness examination for [place].  Hearing/Vision screen Hearing Screening - Comments:: Pt c/o mild hearing difficulty, declines hearing aids. 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 Edison Pace  Dietary issues and exercise activities discussed: Current Exercise Habits: The patient does not participate in regular  exercise at present, Exercise limited by: respiratory conditions(s)   Goals Addressed             This Visit's Progress    Patient Stated   On track    Patient states he would like to stay active and healthy        Depression Screen PHQ 2/9 Scores 02/17/2021 09/24/2020 03/07/2020 02/14/2020 09/06/2019 09/06/2019 02/08/2019  PHQ - 2 Score 0 0 0 2 6 0 0  PHQ- 9 Score - 0 '2 5 12 ' - -    Fall Risk Fall Risk  02/17/2021 09/24/2020 03/07/2020 02/14/2020 02/08/2019  Falls in the past year? 0 0 0 0 0  Number falls in past yr: 0 0 - 0 0  Injury  with Fall? 0 0 - 0 0  Comment - - - - -  Risk for fall due to : No Fall Risks - No Fall Risks No Fall Risks -  Follow up Falls prevention discussed Falls evaluation completed Falls evaluation completed Falls prevention discussed Falls evaluation completed    [place]:  Any stairs in or around the home? No  If so, are there any without handrails? No  Home free of loose throw rugs in walkways, pet beds, electrical cords, etc? Yes  Adequate lighting in your home to reduce risk of falls? Yes   ASSISTIVE DEVICES UTILIZED TO PREVENT FALLS:  Life alert? No  Use of a cane, walker or w/c? No  Grab bars in the bathroom? Yes  Shower chair or bench in shower? Yes  Elevated toilet seat or a handicapped toilet? No   TIMED UP AND GO:  Was the test performed? Yes .  Length of time to ambulate 10 feet: 4 sec.   Gait steady and fast without use of assistive device  Cognitive Function: Normal cognitive status assessed by direct observation by this Nurse Health Advisor. No abnormalities found.       6CIT Screen 02/14/2020 02/01/2019  What Year? 0 points 0 points  What month? 0 points 0 points  What time? 0 points 0 points  Count back from 20 0 points 0 points  Months in reverse 0 points 2 points  Repeat phrase 0 points 2 points  Total Score 0 4    Immunizations Immunization History  Administered Date(s) Administered    Influenza, High Dose Seasonal PF 05/06/2017, 05/17/2019, 05/07/2020   Influenza,inj,Quad PF,6+ Mos 06/02/2018   Influenza-Unspecified 05/31/2013, 05/17/2014   MMR 03/10/1999   PFIZER(Purple Top)SARS-COV-2 Vaccination 11/22/2019, 12/12/2019   Pneumococcal Conjugate-13 02/08/2019   Pneumococcal Polysaccharide-23 02/14/2020   Tdap 01/19/2018   Zoster, Live 07/11/2014    TDAP status: Up to date  Flu Vaccine status: Up to date  Pneumococcal vaccine status: Up to date  Covid-19 vaccine status: Completed vaccines  Qualifies for Shingles Vaccine? Yes   Zostavax completed Yes   Shingrix Completed?: No.    Education has been provided regarding the importance of this vaccine. Patient has been advised to call insurance company to determine out of pocket expense if they have not yet received this vaccine. Advised may also receive vaccine at local pharmacy or Health Dept. Verbalized acceptance and understanding.  Screening Tests Health Maintenance  Topic Date Due   Zoster Vaccines- Shingrix (1 of 2) Never done   COVID-19 Vaccine (3 - Booster for Pfizer series) 05/13/2020   COLONOSCOPY (Pts 45-10yr Insurance coverage will need to be confirmed)  09/24/2021 (Originally 08/25/2020)   INFLUENZA VACCINE  03/24/2021   TETANUS/TDAP  01/20/2028   Hepatitis C Screening  Completed   PNA vac Low Risk Adult  Completed   HPV VACCINES  Aged Out    Health Maintenance  Health Maintenance Due  Topic Date Due   Zoster Vaccines- Shingrix (1 of 2) Never done   COVID-19 Vaccine (3 - Booster for Pfizer series) 05/13/2020    Colorectal cancer screening: Type of screening: Colonoscopy. Completed 2012. Repeat every 10 years. Pt declines repeat screening at this time.   Lung Cancer Screening: (Low Dose CT Chest recommended if Age 69-80years, 30 pack-year currently smoking OR have quit w/in 15years.) does not qualify.   Additional Screening:  Hepatitis C Screening: does qualify; Completed 02/08/19  Vision  Screening: Recommended annual ophthalmology  exams for early detection of glaucoma and other disorders of the eye. Is the patient up to date with their annual eye exam?  Yes  Who is the provider or what is the name of the office in which the patient attends annual eye exams? [place] Screening: Recommended annual dental exams for proper oral hygiene  Community Resource Referral / Chronic Care Management: CRR required this visit?  No   CCM required this visit?  Yes  - medication assistance for inhalers     Plan:     I have personally reviewed and noted the following in the patient's chart:   Medical and social history Use of alcohol, tobacco or illicit drugs  Current medications and supplements including opioid prescriptions. Patient is not currently taking opioid prescriptions. Functional ability and status Nutritional status Physical activity Advanced directives List of other physicians Hospitalizations, surgeries, and ER visits in previous 12 months Vitals Screenings to include cognitive, depression, and falls Referrals and appointments  In addition, I have reviewed and discussed with patient certain preventive protocols, quality metrics, and best practice recommendations. A written personalized care plan for preventive services as well as general preventive health recommendations were provided to patient.     Clemetine Marker, LPN   1/88/4166   Nurse Notes: none

## 2021-02-18 ENCOUNTER — Telehealth: Payer: Self-pay

## 2021-02-18 NOTE — Chronic Care Management (AMB) (Signed)
  Chronic Care Management   Note  02/18/2021 Name: [name] MRN: [id number] DOB: 1951/06/20  Eric Stanton is a 70 y.o. year old male who is a primary care patient of Glean Hess, MD. I reached out to Eric Stanton by phone today in response to a referral sent by [name]'s patient's AWV (annual wellness visit) nurse, Clemetine Marker, LPN.      [name] was given information about Chronic Care Management services today including:  CCM service includes personalized support from designated clinical staff supervised by his physician, including individualized plan of care and coordination with other care providers 24/7 contact phone numbers for assistance for urgent and routine care needs. Service will only be billed when office clinical staff spend 20 minutes or more in a month to coordinate care. Only one practitioner may furnish and bill the service in a calendar month. The patient may stop CCM services at any time (effective at the end of the month) by phone call to the office staff. The patient will be responsible for cost sharing (co-pay) of up to 20% of the service fee (after annual deductible is met).  Patient agreed to services and verbal consent obtained.   Follow up plan: Telephone appointment with care management team member scheduled for:02/25/2021  Noreene Larsson, [place], [place], [place] [address] Direct Dial: [phone] [email or web address]: [email or web address]

## 2021-02-21 ENCOUNTER — Other Ambulatory Visit: Payer: Self-pay | Admitting: Internal Medicine

## 2021-02-21 DIAGNOSIS — E785 Hyperlipidemia, unspecified: Secondary | ICD-10-CM

## 2021-02-25 ENCOUNTER — Telehealth: Payer: Self-pay | Admitting: Pharmacist

## 2021-02-25 ENCOUNTER — Ambulatory Visit (INDEPENDENT_AMBULATORY_CARE_PROVIDER_SITE_OTHER): Payer: Medicare HMO | Admitting: Pharmacist

## 2021-02-25 DIAGNOSIS — E782 Mixed hyperlipidemia: Secondary | ICD-10-CM

## 2021-02-25 DIAGNOSIS — J449 Chronic obstructive pulmonary disease, unspecified: Secondary | ICD-10-CM | POA: Diagnosis not present

## 2021-02-25 NOTE — Patient Instructions (Signed)
Visit Information  It was a pleasure speaking with you today! Thank you for letting me be a part of your care team. Please call with any questions or concerns.   Goals Addressed             This Visit's Progress    Track and Manage My Symptoms-COPD       Timeframe:  Long-Range Goal Priority:  Low Start Date:                             Expected End Date:                       Follow Up Date 6 month follow up- develop a rescue plan - eliminate symptom triggers at home - follow rescue plan if symptoms flare-up - keep follow-up appointments    Why is this important?   Tracking your symptoms and other information about your health helps your doctor plan your care.  Write down the symptoms, the time of day, what you were doing and what medicine you are taking.  You will soon learn how to manage your symptoms.     Notes:       Track and Manage My Triggers-COPD       Timeframe:  Long-Range Goal Priority:  Low Start Date:                             Expected End Date:                       Follow Up Date 6 month follow up   - avoid second hand smoke - eliminate smoking in my home - identify and remove indoor air pollutants - listen for public air quality announcements every day    Why is this important?   Triggers are activities or things, like tobacco smoke or cold weather, that make your COPD (chronic obstructive pulmonary disease) flare-up.  Knowing these triggers helps you plan how to stay away from them.  When you cannot remove them, you can learn how to manage them.     Notes:          Mr. Krisko was given information about Chronic Care Management services today including:  CCM service includes personalized support from designated clinical staff supervised by his physician, including individualized plan of care and coordination with other care providers 24/7 contact phone numbers for assistance for urgent and routine care needs. Standard insurance, coinsurance,  copays and deductibles apply for chronic care management only during months in which we provide at least 20 minutes of these services. Most insurances cover these services at 100%, however patients may be responsible for any copay, coinsurance and/or deductible if applicable. This service may help you avoid the need for more expensive face-to-face services. Only one practitioner may furnish and bill the service in a calendar month. The patient may stop CCM services at any time (effective at the end of the month) by phone call to the office staff.  Patient agreed to services and verbal consent obtained.   The patient verbalized understanding of instructions, educational materials, and care plan provided today and agreed to receive a mailed copy of patient instructions, educational materials, and care plan.  Telephone follow up appointment with pharmacy team member scheduled for: 6 months PharmD, 1 month CPA  [place] [address]. Tiburcio Pea PharmD,  BCPS Clinical Pharmacist [phone]

## 2021-02-25 NOTE — Progress Notes (Signed)
Chronic Care Management Pharmacy Note  02/25/2021 Name:  Eric Stanton MRN:  [id number] DOB:  10/05/1950   Recommendations/Changes made from today's visit: Vitamin D and B12 levels with next labs. Based on patient reported income and year to date out of pocket RX expenses patient should qualify for PAP. CPA to mail applications for Combivent and Advair HFA to patient.  Plan: CPA to follow up in one month.   Subjective: Eric Stanton is an 70 y.o. year old male who is a primary patient of Army Melia, Jesse Sans, MD.  The CCM team was consulted for assistance with disease management and care coordination needs.    Engaged with patient by telephone for initial visit in response to provider referral for pharmacy case management and/or care coordination services.   Consent to Services:  The patient was given the following information about Chronic Care Management services today, agreed to services, and gave verbal consent: 1. CCM service includes personalized support from designated clinical staff supervised by the primary care provider, including individualized plan of care and coordination with other care providers 2. 24/7 contact phone numbers for assistance for urgent and routine care needs. 3. Service will only be billed when office clinical staff spend 20 minutes or more in a month to coordinate care. 4. Only one practitioner may furnish and bill the service in a calendar month. 5.The patient may stop CCM services at any time (effective at the end of the month) by phone call to the office staff. 6. The patient will be responsible for cost sharing (co-pay) of up to 20% of the service fee (after annual deductible is met). Patient agreed to services and consent obtained.  Patient Care Team: Glean Hess, MD as PCP - General (Internal Medicine) Vladimir Faster, [facility] (Pharmacist)  Recent office visits: 2/01/22Army Melia (PCP)- blood work  Recent consult visits: [facility] visits: None in  previous 6 months   Objective:  Lab Results  Component Value Date   CREATININE 1.04 10/03/2020   BUN 11 10/03/2020   GFRNONAA 73 10/03/2020   GFRAA 85 10/03/2020   NA 139 10/03/2020   K 3.9 10/03/2020   CALCIUM 8.9 10/03/2020   CO2 22 10/03/2020   GLUCOSE 100 (H) 10/03/2020    No results found for: HGBA1C, FRUCTOSAMINE, GFR, MICROALBUR  Last diabetic Eye exam: No results found for: HMDIABEYEEXA  Last diabetic Foot exam: No results found for: HMDIABFOOTEX   Lab Results  Component Value Date   CHOL 138 10/03/2020   HDL 46 10/03/2020   LDLCALC 77 10/03/2020   TRIG 73 10/03/2020   CHOLHDL 3.0 10/03/2020    Hepatic Function Latest Ref Rng & Units 10/03/2020 03/07/2020 02/08/2019  Total Protein 6.0 - 8.5 g/dL 6.5 6.6 6.8  Albumin 3.8 - 4.8 g/dL 4.3 4.1 4.4  AST 0 - 40 IU/L '9 8 5  ' ALT 0 - 44 IU/L '9 7 7  ' Alk Phosphatase 44 - 121 IU/L 91 92 87  Total Bilirubin 0.0 - 1.2 mg/dL 0.6 0.4 0.4    No results found for: TSH, FREET4  CBC Latest Ref Rng & Units 04/08/2020 03/07/2020 02/08/2019  WBC 3.4 - 10.8 x10E3/uL 2.8(L) 2.8(L) 3.8  Hemoglobin 13.0 - 17.7 g/dL 13.0 13.7 14.6  Hematocrit 37.5 - 51.0 % 37.9 38.6 42.2  Platelets 150 - 450 x10E3/uL 183 208 260    No results found for: VD25OH  Clinical ASCVD: No  The 10-year ASCVD risk score Mikey Bussing DC Jr., et al., 2013)  is: 11.3%   Values used to calculate the score:     Age: 72 years     Sex: Male     Is Non-Hispanic African American: No     Diabetic: No     Tobacco smoker: No     Systolic Blood Pressure: 935 mmHg     Is BP treated: No     HDL Cholesterol: 46 mg/dL     Total Cholesterol: 138 mg/dL    Depression screen [facility] 2/9 02/17/2021 09/24/2020 03/07/2020  Decreased Interest 0 0 0  Down, Depressed, Hopeless 0 0 0  PHQ - 2 Score 0 0 0  Altered sleeping - 0 2  Tired, decreased energy - 0 0  Change in appetite - 0 0  Feeling bad or failure about yourself  - 0 0  Trouble concentrating - 0 0  Moving slowly or fidgety/restless  - 0 0  Suicidal thoughts - 0 0  PHQ-9 Score - 0 2  Difficult doing work/chores - Not difficult at all Not difficult at all   No flowsheet data found.    Social History   Tobacco Use  Smoking Status Former   Packs/day: 2.00   Years: 20.00   Pack years: 40.00   Types: Cigarettes   Quit date: 40   Years since quitting: 37.5  Smokeless Tobacco Never  Tobacco Comments   Smoking cessation materials not required   BP Readings from Last 3 Encounters:  02/17/21 110/70  09/24/20 132/84  03/07/20 122/82   Pulse Readings from Last 3 Encounters:  02/17/21 81  09/24/20 89  03/07/20 80   Wt Readings from Last 3 Encounters:  02/17/21 166 lb 12.8 oz (75.7 kg)  09/24/20 175 lb (79.4 kg)  03/07/20 164 lb (74.4 kg)   BMI Readings from Last 3 Encounters:  02/17/21 23.93 kg/m  09/24/20 25.11 kg/m  03/07/20 23.53 kg/m    Assessment/Interventions: Review of patient past medical history, allergies, medications, health status, including review of consultants reports, laboratory and other test data, was performed as part of comprehensive evaluation and provision of chronic care management services.   SDOH:  (Social Determinants of Health) assessments and interventions performed: Yes  SDOH Screenings   Alcohol Screen: Low Risk    Last Alcohol Screening Score (AUDIT): 0  Depression (PHQ2-9): Low Risk    PHQ-2 Score: 0  Financial Resource Strain: Low Risk    Difficulty of Paying Living Expenses: Not very hard  Food Insecurity: No Food Insecurity   Worried About Charity fundraiser in the Last Year: Never true   Ran Out of Food in the Last Year: Never true  Housing: Low Risk    Last Housing Risk Score: 0  Physical Activity: Inactive   Days of Exercise per Week: 0 days   Minutes of Exercise per Session: 0 min  Social Connections: Moderately Integrated   Frequency of Communication with Friends and Family: More than three times a week   Frequency of Social Gatherings with Friends  and Family: Three times a week   Attends Religious Services: More than 4 times per year   Active Member of Clubs or Organizations: No   Attends Archivist Meetings: Never   Marital Status: Married  Stress: No Stress Concern Present   Feeling of Stress : Not at all  Tobacco Use: Medium Risk   Smoking Tobacco Use: Former   Smokeless Tobacco Use: Never  Transportation Needs: No Data processing manager (Medical): No  Lack of Transportation (Non-Medical): No     Immunization History  Administered Date(s) Administered   Influenza, High Dose Seasonal PF 05/06/2017, 05/17/2019, 05/07/2020   Influenza,inj,Quad PF,6+ Mos 06/02/2018   Influenza-Unspecified 05/31/2013, 05/17/2014   MMR 03/10/1999   PFIZER(Purple Top)SARS-COV-2 Vaccination 11/22/2019, 12/12/2019   Pneumococcal Conjugate-13 02/08/2019   Pneumococcal Polysaccharide-23 02/14/2020   Tdap 01/19/2018   Zoster, Live 07/11/2014    Conditions to be addressed/monitored:  Hyperlipidemia, GERD, and COPD  Care Plan : [place]  Updates made by Vladimir Faster, [place] since 02/25/2021 12:00 AM     Problem: COPD, GERD, HLD   Priority: High     Goal: Disease Management   This Visit's Progress: On track  Priority: High  Note:   Current Barriers:  Unable to independently afford treatment regimen Does not adhere to prescribed medication regimen Does not contact provider office for questions/concerns  Pharmacist Clinical Goal(s):  Patient will verbalize ability to afford treatment regimen achieve adherence to monitoring guidelines and medication adherence to achieve therapeutic efficacy adhere to prescribed medication regimen as evidenced by patient report and fill dates contact provider office for questions/concerns as evidenced notation of same in electronic health record through collaboration with PharmD and provider.   Interventions: 1:1 collaboration with Glean Hess, MD  regarding development and update of comprehensive plan of care as evidenced by provider attestation and co-signature Inter-disciplinary care team collaboration (see longitudinal plan of care) Comprehensive medication review performed; medication list updated in electronic medical record   CAT ASSESSMENT  Rank each of the following items on a scale of 0 to 5 (with 5 being most severe) Write a # 0-5 in each box  I never cough (0) > I cough all the time (5) 1  I have no phlegm (mucus) in my chest (0) > My chest is completely full of phlegm (mucus) (5) 1  My chest does not feel tight at all (0) > My chest feels very tight (5) 0  When I walk up a hill or one flight of stairs I am not breathless (0) > When I walk up a hill or one flight of stairs I am very breathless (5) 0  I am not limited doing any activities at home (0) > I am very limited doing activities at home (5) 0  I am confident leaving my home despite my lung function (0) > I am not at all confident leaving my home because of my lung condition (5)  0  I sleep soundly (0) > I don't sleep soundly because of my lung condition (5) 0  I have lots of energy (0) > I have no energy at all (5) 1  Total CAT Score: 3  COPD (Goal: control symptoms and prevent exacerbations) -Not ideally controlled -Current treatment  Advair HFA 1 puff qam (prescribed 2 puffs bid) Combivent 1 puff 2-4 times daily -Medications previously tried: NA  -Gold Grade: Unknown -- need spirometry -Current COPD Classification:  A (low sx, <2 exacerbations/yr) -MMRC/CAT score: 3 -Pulmonary function testing: unknown -Exacerbations requiring treatment in last 6 months: o -Patient reports consistent use of maintenance inhaler at dose & frequency less than prescribed -Frequency of rescue inhaler use: 2-3 times daily most days -Counseled on Benefits of consistent maintenance inhaler use When to use rescue inhaler Differences between maintenance and rescue  inhalers -Recommended to continue current medication Assessed patient finances. Will initiate patient assistance for Advair and Combivent .[place] PAP approval has been difficult to obtain this year.  If denied, can apply for Bevespi through [place].  The 10-year ASCVD risk score Mikey Bussing DC Jr., et al., 2013) is: 11.3% Lab Results  Component Value Date   LDLCALC 77 10/03/2020  Last vitamin D No results found for: 25OHVITD2, 25OHVITD3, VD25OH  Hyperlipidemia: (LDL goal < 100) -Controlled -Current treatment: Atorvastatin 10 mg qd Aspirin EC 81 mg qd Fish oil 1000 mg qd -Medications previously tried: na  -Current dietary patterns: eats a mostly healthy diet -Current exercise habits: no structured regimen is very active, likes to golf when weather is cooler -Educated on Cholesterol goals;  Benefits of statin for ASCVD risk reduction; Exercise goal of 150 minutes per week; Counseled that 2019 ACC/AHA guidelines recommend against routine use of low-dose aspirin as primary prevention in patients >70.   -Recommended patient continue atorvastatin and discuss risk vs. Benefit of continued aspirin with PCP. -Recommended vitamin d level with next labs.  GERD (Goal: minimize symptoms) -Controlled -Current treatment  Famotidine 20 mg qd -Medications previously tried: na  -Recommended to continue current medication  No results found for: [place] Maintenance -Vaccine gaps: Shingrix, COVID booster -Current therapy:  Vitamin C 1000 mg qd Vitamin b12 1000 mcg qd Preservision Areds daily Saw Palmetto 1000 mg qd -Educated on Cost vs benefit of each product must be carefully weighed by individual consumer Supplements may interfere with prescription drugs -Patient is satisfied with current therapy and denies issues -Recommended to continue current medication Recommended B12 level with next labs Recommended patient discontinue saw palmetto. Denies and BPH symptoms  Patient Goals/Self-Care  Activities Patient will:  - take medications as prescribed collaborate with provider on medication access solutions target a minimum of 150 minutes of moderate intensity exercise weekly  Follow Up Plan: Telephone follow up appointment with care management team member scheduled for:       Medication Assistance: Application for Advair and combivent   medication assistance program. in process.  Anticipated assistance start date unknown.  See plan of care for additional detail.  Compliance/Adherence/Medication fill history: Care Gaps: Shingrix, Covid Booster  Star-Rating Drugs: Atorvastatin 10 mg #90 11/26/20  Patient's preferred pharmacy is:  [id number] [place] [id number]: [phone]: [phone] Uses pill box? No - doesn't need Pt endorses 95% compliance  We discussed: Benefits of medication synchronization, packaging and delivery as well as enhanced pharmacist oversight with [employer]. Patient decided to: Continue current medication management strategy  Care Plan and Follow Up Patient Decision:  Patient agrees to Care Plan and Follow-up.  Plan: Telephone follow up appointment with care management team member scheduled for:   1 month CPA

## 2021-02-25 NOTE — Chronic Care Management (AMB) (Signed)
    Chronic Care Management Pharmacy Assistant   Name: EOIN WILLDEN  MRN: [id number] DOB: 1951-06-23   Reason for Encounter: Patient assistance application Combivent and Advair     Medications: Outpatient Encounter Medications as of 02/25/2021  Medication Sig Note   Ascorbic Acid (VITAMIN C) 1000 MG tablet Take 1,000 mg by mouth daily.    aspirin EC 81 MG tablet Take by mouth.    atorvastatin (LIPITOR) 10 MG tablet TAKE 1 TABLET BY MOUTH EVERY DAY    docusate sodium (COLACE) 100 MG capsule Take by mouth.    famotidine (PEPCID) 20 MG tablet Take 20 mg by mouth daily.     fluticasone-salmeterol (ADVAIR HFA) 230-21 MCG/ACT inhaler Inhale 2 puffs into the lungs 2 (two) times daily.    Ipratropium-Albuterol (COMBIVENT RESPIMAT) 20-100 MCG/ACT AERS respimat 1 INHALATION INTO THE LUNGS 4 TIMES DAILY AS NEEDED FOR WHEEZING    Multiple Vitamins-Minerals (PRESERVISION AREDS 2+MULTI VIT PO) Take by mouth. 02/14/2020: Ocuvite   Omega-3 Fatty Acids (FISH OIL) 1000 MG CAPS Take by mouth.    Saw Palmetto 1000 MG CAPS Take by mouth.    vitamin B-12 (CYANOCOBALAMIN) 1000 MCG tablet Take 1,000 mcg by mouth daily.    No facility-administered encounter medications on file as of 02/25/2021.    Completed patient assistance application for combivent and advair. Mailed to patient to complete application. Patient will then mail completed application to Bari Edward, MD for completion.  Lura Em Clinical Pharmacist Assistant [phone]

## 2021-03-12 ENCOUNTER — Other Ambulatory Visit: Payer: Self-pay

## 2021-03-12 ENCOUNTER — Encounter: Payer: Self-pay | Admitting: Internal Medicine

## 2021-03-12 ENCOUNTER — Ambulatory Visit (INDEPENDENT_AMBULATORY_CARE_PROVIDER_SITE_OTHER): Payer: Medicare HMO | Admitting: Internal Medicine

## 2021-03-12 VITALS — BP 130/78 | HR 76 | Temp 97.7°F | Ht 70.0 in | Wt 164.0 lb

## 2021-03-12 DIAGNOSIS — Z0000 Encounter for general adult medical examination without abnormal findings: Secondary | ICD-10-CM

## 2021-03-12 DIAGNOSIS — J449 Chronic obstructive pulmonary disease, unspecified: Secondary | ICD-10-CM | POA: Diagnosis not present

## 2021-03-12 DIAGNOSIS — H3530 Unspecified macular degeneration: Secondary | ICD-10-CM

## 2021-03-12 DIAGNOSIS — E785 Hyperlipidemia, unspecified: Secondary | ICD-10-CM

## 2021-03-12 DIAGNOSIS — Z125 Encounter for screening for malignant neoplasm of prostate: Secondary | ICD-10-CM | POA: Diagnosis not present

## 2021-03-12 DIAGNOSIS — K2100 Gastro-esophageal reflux disease with esophagitis, without bleeding: Secondary | ICD-10-CM | POA: Diagnosis not present

## 2021-03-12 DIAGNOSIS — Z1211 Encounter for screening for malignant neoplasm of colon: Secondary | ICD-10-CM

## 2021-03-12 NOTE — Progress Notes (Signed)
Date:  03/12/2021   Name:  Eric Stanton   DOB:  Jul 20, 1951   MRN:  [id number]   Chief Complaint: Annual Exam ONIS Eric Stanton is a 70 y.o. male who presents today for his Complete Annual Exam. He feels well. He reports exercising plays golf every other week. He reports he is sleeping well.   Colonoscopy: 09/2010 - due  Immunization History  Administered Date(s) Administered   Influenza, High Dose Seasonal PF 05/06/2017, 05/17/2019, 05/07/2020   Influenza,inj,Quad PF,6+ Mos 06/02/2018   Influenza-Unspecified 05/31/2013, 05/17/2014   MMR 03/10/1999   PFIZER(Purple Top)SARS-COV-2 Vaccination 11/22/2019, 12/12/2019   Pneumococcal Conjugate-13 02/08/2019   Pneumococcal Polysaccharide-23 02/14/2020   Tdap 01/19/2018   Zoster, Live 07/11/2014    Gastroesophageal Reflux He complains of heartburn. He reports no abdominal pain, no chest pain, no choking or no wheezing. This is a recurrent problem. The problem occurs occasionally. Pertinent negatives include no fatigue. He has tried a histamine-2 antagonist for the symptoms. The treatment provided significant relief.  Hyperlipidemia This is a chronic problem. The problem is controlled. Associated symptoms include shortness of breath. Pertinent negatives include no chest pain or myalgias. Current antihyperlipidemic treatment includes statins. The current treatment provides significant improvement of lipids.  Shortness of Breath This is a chronic problem. The problem has been unchanged. Pertinent negatives include no abdominal pain, chest pain, headaches, leg swelling, rash or wheezing. The symptoms are aggravated by exercise, weather changes and pollens. He has tried beta agonist inhalers, ipratropium inhalers and steroid inhalers for the symptoms. The treatment provided moderate relief. His past medical history is significant for COPD.   Lab Results  Component Value Date   CREATININE 1.04 10/03/2020   BUN 11 10/03/2020   NA 139 10/03/2020   K  3.9 10/03/2020   CL 101 10/03/2020   CO2 22 10/03/2020   Lab Results  Component Value Date   CHOL 138 10/03/2020   HDL 46 10/03/2020   LDLCALC 77 10/03/2020   TRIG 73 10/03/2020   CHOLHDL 3.0 10/03/2020   No results found for: TSH No results found for: HGBA1C Lab Results  Component Value Date   WBC 2.8 (L) 04/08/2020   HGB 13.0 04/08/2020   HCT 37.9 04/08/2020   MCV 93 04/08/2020   PLT 183 04/08/2020   Lab Results  Component Value Date   ALT 9 10/03/2020   AST 9 10/03/2020   ALKPHOS 91 10/03/2020   BILITOT 0.6 10/03/2020     Review of Systems  Constitutional:  Negative for appetite change, chills, diaphoresis, fatigue and unexpected weight change.  HENT:  Positive for hearing loss. Negative for tinnitus, trouble swallowing and voice change.   Eyes:  Positive for visual disturbance (macular degeneration on right).  Respiratory:  Positive for shortness of breath. Negative for choking and wheezing.   Cardiovascular:  Negative for chest pain, palpitations and leg swelling.  Gastrointestinal:  Positive for heartburn. Negative for abdominal pain, blood in stool, constipation and diarrhea.  Genitourinary:  Negative for difficulty urinating, dysuria and frequency.  Musculoskeletal:  Negative for arthralgias, back pain and myalgias.  Skin:  Negative for color change and rash.  Neurological:  Negative for dizziness, syncope and headaches.  Hematological:  Negative for adenopathy.  Psychiatric/Behavioral:  Negative for dysphoric mood and sleep disturbance. The patient is not nervous/anxious.    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   Mild hyperlipidemia 02/09/2019   History of esophageal stricture 11/04/2018  COPD (chronic obstructive pulmonary disease) ([place]) 11/01/2018   GERD (gastroesophageal reflux disease) 11/01/2018    Allergies  Allergen Reactions   Codeine Other (See Comments)    Hyperactivity   Oxycodone-Acetaminophen Other  (See Comments)    Hyperactivity    Past Surgical History:  Procedure Laterality Date   HERNIA REPAIR  2010    Social History   Tobacco Use   Smoking status: Former    Packs/day: 2.00    Years: 20.00    Pack years: 40.00    Types: Cigarettes    Quit date: 1985    Years since quitting: 37.5   Smokeless tobacco: Never   Tobacco comments:    Smoking cessation materials not required  Vaping Use   Vaping Use: Never used  Substance Use Topics   Alcohol use: Not Currently   Drug use: Never     Medication list has been reviewed and updated.  Current Meds  Medication Sig   Ascorbic Acid (VITAMIN C) 1000 MG tablet Take 1,000 mg by mouth daily.   aspirin EC 81 MG tablet Take by mouth.   atorvastatin (LIPITOR) 10 MG tablet TAKE 1 TABLET BY MOUTH EVERY DAY   docusate sodium (COLACE) 100 MG capsule Take by mouth.   famotidine (PEPCID) 20 MG tablet Take 20 mg by mouth daily.    fluticasone-salmeterol (ADVAIR HFA) 230-21 MCG/ACT inhaler Inhale 2 puffs into the lungs 2 (two) times daily.   Ipratropium-Albuterol (COMBIVENT RESPIMAT) 20-100 MCG/ACT AERS respimat 1 INHALATION INTO THE LUNGS 4 TIMES DAILY AS NEEDED FOR WHEEZING   Multiple Vitamins-Minerals (PRESERVISION AREDS 2+MULTI VIT PO) Take by mouth.   Omega-3 Fatty Acids (FISH OIL) 1000 MG CAPS Take by mouth.   Saw Palmetto 1000 MG CAPS Take by mouth.   vitamin B-12 (CYANOCOBALAMIN) 1000 MCG tablet Take 1,000 mcg by mouth daily.    PHQ 2/9 Scores 03/12/2021 02/17/2021 09/24/2020 03/07/2020  PHQ - 2 Score 0 0 0 0  PHQ- 9 Score 1 - 0 2    GAD 7 : Generalized Anxiety Score 03/12/2021 09/24/2020 03/07/2020  Nervous, Anxious, on Edge 0 0 0  Control/stop worrying 0 0 0  Worry too much - different things 0 0 0  Trouble relaxing 0 0 0  Restless 0 0 0  Easily annoyed or irritable 0 0 0  Afraid - awful might happen 0 0 0  Total GAD 7 Score 0 0 0  Anxiety Difficulty - Not difficult at all Not difficult at all    BP Readings from Last 3  Encounters:  03/12/21 130/78  02/17/21 110/70  09/24/20 132/84    Physical Exam Vitals and nursing note reviewed.  Constitutional:      Appearance: Normal appearance. He is well-developed.  HENT:     Head: Normocephalic.     Right Ear: Tympanic membrane, ear canal and external ear normal. Decreased hearing noted.     Left Ear: Tympanic membrane, ear canal and external ear normal. Decreased hearing noted.     Nose: Nose normal.  Eyes:     Conjunctiva/sclera: Conjunctivae normal.     Pupils: Pupils are equal, round, and reactive to light.  Neck:     Thyroid: No thyromegaly.     Vascular: No carotid bruit.  Cardiovascular:     Rate and Rhythm: Normal rate and regular rhythm.     Heart sounds: Normal heart sounds.  Pulmonary:     Effort: Pulmonary effort is normal.     Breath sounds: Normal breath sounds.  No wheezing.  Chest:  Breasts:    Right: No mass.     Left: No mass.  Abdominal:     General: Bowel sounds are normal.     Palpations: Abdomen is soft.     Tenderness: There is no abdominal tenderness.  Musculoskeletal:        General: Normal range of motion.     Cervical back: Normal range of motion and neck supple.  Lymphadenopathy:     Cervical: No cervical adenopathy.  Skin:    General: Skin is warm and dry.  Neurological:     Mental Status: He is alert and oriented to person, place, and time.     Deep Tendon Reflexes: Reflexes are normal and symmetric.  Psychiatric:        Attention and Perception: Attention normal.        Mood and Affect: Mood normal.        Thought Content: Thought content normal.    Wt Readings from Last 3 Encounters:  03/12/21 164 lb (74.4 kg)  02/17/21 166 lb 12.8 oz (75.7 kg)  09/24/20 175 lb (79.4 kg)    BP 130/78 (BP Location: Right Arm, Patient Position: Sitting, Cuff Size: Normal)   Pulse 76   Temp 97.7 F (36.5 C) (Oral)   Ht '5\' 10"'  (1.778 m)   Wt 164 lb (74.4 kg)   SpO2 98%   BMI 23.53 kg/m   Assessment and Plan: 1.  Annual physical exam Normal exam Up to date on immunizations; declines Shingrix - Comprehensive metabolic panel  2. Prostate cancer screening DRE deferred to lack of sx - PSA  3. Chronic obstructive pulmonary disease, unspecified COPD type (HCC) Stable sx on Advair and albuterol  4. Gastroesophageal reflux disease with esophagitis without hemorrhage Symptoms well controlled on daily H2 blocker No red flag signs such as weight loss, n/v, melena Will continue pepcid. - CBC with Differential/Platelet  5. Mild hyperlipidemia Tolerating statin medication without side effects at this time Continue same therapy without change at this time. - Lipid panel  6. Macular degeneration of right eye, unspecified type Followed by Ophthalmology  7. Colon cancer screening He declines colonoscopy but will do FIT - Fecal occult blood, imunochemical   Partially dictated using Editor, commissioning. Any errors are unintentional.  Halina Maidens, MD [facility]  03/12/2021

## 2021-03-13 LAB — CBC WITH DIFFERENTIAL/PLATELET
Basophils Absolute: 0 10*3/uL (ref 0.0–0.2)
Basos: 1 %
EOS (ABSOLUTE): 0.2 10*3/uL (ref 0.0–0.4)
Eos: 8 %
Hematocrit: 37.6 % (ref 37.5–51.0)
Hemoglobin: 13.1 g/dL (ref 13.0–17.7)
Immature Grans (Abs): 0 10*3/uL (ref 0.0–0.1)
Immature Granulocytes: 0 %
Lymphocytes Absolute: 0.8 10*3/uL (ref 0.7–3.1)
Lymphs: 34 %
MCH: 32 pg (ref 26.6–33.0)
MCHC: 34.8 g/dL (ref 31.5–35.7)
MCV: 92 fL (ref 79–97)
Monocytes Absolute: 0.2 10*3/uL (ref 0.1–0.9)
Monocytes: 7 %
Neutrophils Absolute: 1.1 10*3/uL — ABNORMAL LOW (ref 1.4–7.0)
Neutrophils: 50 %
Platelets: 199 10*3/uL (ref 150–450)
RBC: 4.09 x10E6/uL — ABNORMAL LOW (ref 4.14–5.80)
RDW: 11.8 % (ref 11.6–15.4)
WBC: 2.2 10*3/uL — CL (ref 3.4–10.8)

## 2021-03-13 LAB — COMPREHENSIVE METABOLIC PANEL
ALT: 7 IU/L (ref 0–44)
AST: 8 IU/L (ref 0–40)
Albumin/Globulin Ratio: 2 (ref 1.2–2.2)
Albumin: 4.3 g/dL (ref 3.8–4.8)
Alkaline Phosphatase: 91 IU/L (ref 44–121)
BUN/Creatinine Ratio: 10 (ref 10–24)
BUN: 10 mg/dL (ref 8–27)
Bilirubin Total: 0.5 mg/dL (ref 0.0–1.2)
CO2: 25 mmol/L (ref 20–29)
Calcium: 9.2 mg/dL (ref 8.6–10.2)
Chloride: 103 mmol/L (ref 96–106)
Creatinine, Ser: 1 mg/dL (ref 0.76–1.27)
Globulin, Total: 2.2 g/dL (ref 1.5–4.5)
Glucose: 116 mg/dL — ABNORMAL HIGH (ref 65–99)
Potassium: 4 mmol/L (ref 3.5–5.2)
Sodium: 141 mmol/L (ref 134–144)
Total Protein: 6.5 g/dL (ref 6.0–8.5)
eGFR: 81 mL/min/{1.73_m2} (ref >59)

## 2021-03-13 LAB — LIPID PANEL
Chol/HDL Ratio: 2.3 ratio (ref 0.0–5.0)
Cholesterol, Total: 117 mg/dL (ref 100–199)
HDL: 52 mg/dL (ref >39)
LDL Chol Calc (NIH): 54 mg/dL (ref 0–99)
Triglycerides: 48 mg/dL (ref 0–149)
VLDL Cholesterol Cal: 11 mg/dL (ref 5–40)

## 2021-03-13 LAB — FECAL OCCULT BLOOD, IMMUNOCHEMICAL: Fecal Occult Bld: NEGATIVE

## 2021-03-13 LAB — PSA: Prostate Specific Ag, Serum: 0.7 ng/mL (ref 0.0–4.0)

## 2021-03-20 LAB — SPECIMEN STATUS REPORT

## 2021-03-20 LAB — HGB A1C W/O EAG: Hgb A1c MFr Bld: 5.6 % (ref 4.8–5.6)

## 2021-03-20 NOTE — Progress Notes (Signed)
Spoke to pt wife informed her that pt had no DM. She verbalized understanding.  KP

## 2021-04-18 ENCOUNTER — Telehealth: Payer: Self-pay

## 2021-04-18 NOTE — Progress Notes (Signed)
    Chronic Care Management Pharmacy Assistant   Name: Eric Stanton  MRN: [id number] DOB: 03-13-1951   Reason for Encounter: Disease State COPD    Recent office visits:  03/12/21 Annual wellness exam.   Recent consult visits:  None noted   Hospital visits:  None in previous 6 months  Medications: Outpatient Encounter Medications as of 04/18/2021  Medication Sig Note   Ascorbic Acid (VITAMIN C) 1000 MG tablet Take 1,000 mg by mouth daily.    aspirin EC 81 MG tablet Take by mouth.    atorvastatin (LIPITOR) 10 MG tablet TAKE 1 TABLET BY MOUTH EVERY DAY    docusate sodium (COLACE) 100 MG capsule Take by mouth.    famotidine (PEPCID) 20 MG tablet Take 20 mg by mouth daily.     fluticasone-salmeterol (ADVAIR HFA) 230-21 MCG/ACT inhaler Inhale 2 puffs into the lungs 2 (two) times daily.    Ipratropium-Albuterol (COMBIVENT RESPIMAT) 20-100 MCG/ACT AERS respimat 1 INHALATION INTO THE LUNGS 4 TIMES DAILY AS NEEDED FOR WHEEZING    Multiple Vitamins-Minerals (PRESERVISION AREDS 2+MULTI VIT PO) Take by mouth. 02/14/2020: Ocuvite   Omega-3 Fatty Acids (FISH OIL) 1000 MG CAPS Take by mouth.    Saw Palmetto 1000 MG CAPS Take by mouth.    vitamin B-12 (CYANOCOBALAMIN) 1000 MCG tablet Take 1,000 mcg by mouth daily.    No facility-administered encounter medications on file as of 04/18/2021.   Current COPD regimen: Advair   Any recent hospitalizations or ED visits since last visit with CPP? No  Reports denies COPD symptoms, including Increased shortness of breath , Rescue medicine is not helping, Shortness of breath at rest, Symptoms worse with exercise, Symptoms worse at night, and Wheezing  What recent interventions/DTPs have been made by any provider to improve breathing since last visit: (Per PCP visit on 03/12/21)beta agonist inhalers, ipratropium inhalers and steroid inhalers  Have you had exacerbation/flare-up since last visit? Yes  What do you do when you are short of breath?  Rescue  medication   Respiratory Devices/Equipment Do you have a nebulizer? No Do you use a Peak Flow Meter? No Do you use a maintenance inhaler? Yes How often do you forget to use your daily inhaler? no Do you use a rescue inhaler? Yes How often do you use your rescue inhaler?  prn Patient states that depending on the physical activity that he is doing.  Do you use a spacer with your inhaler? No  Adherence Review: Does the patient have >5 day gap between last estimated fill date for maintenance inhaler medications? No  Mis comment: [name] is a very pleasant man. He states that he changed providers to Dr. Bonita Quin at [facility] on [place]. Patient states that his medications have changed to cybicor and albuterol, and that he is doing well on these medications.    Care Gaps: Shingrix, covid 19 booster, influenza vaccine    Star Rating Drugs: Atorvastatin 10 mg last fill 02/21/21 90 DS   Salli Real, CMA

## 2021-05-30 ENCOUNTER — Other Ambulatory Visit: Payer: Self-pay | Admitting: Internal Medicine

## 2021-05-30 DIAGNOSIS — J449 Chronic obstructive pulmonary disease, unspecified: Secondary | ICD-10-CM

## 2021-05-30 NOTE — Telephone Encounter (Signed)
Requested Prescriptions  Pending Prescriptions Disp Refills  . Ipratropium-Albuterol (COMBIVENT RESPIMAT) 20-100 MCG/ACT AERS respimat [Pharmacy Med Name: COMBIVENT RESPIMAT 20-100 MCG]  12    Sig: TAKE 1 PUFF INTO THE LUNGS FOUR TIMES A DAY AS NEEDED FOR WHEEZING     Pulmonology:  Combination Products Passed - 05/30/2021  2:14 AM      Passed - Valid encounter within last 12 months    Recent Outpatient Visits          2 months ago Annual physical exam   [facility] Reubin Milan, MD   8 months ago Chronic obstructive pulmonary disease, unspecified COPD type [facility]   [facility] Reubin Milan, MD   1 year ago Annual physical exam   [facility] Reubin Milan, MD   1 year ago Chronic obstructive pulmonary disease with acute exacerbation [facility])   [facility] Reubin Milan, MD   2 years ago Annual physical exam   [facility] Reubin Milan, MD      Future Appointments            In 9 months Judithann Graves Nyoka Cowden, MD [facility]

## 2021-08-25 ENCOUNTER — Ambulatory Visit
Admission: EM | Admit: 2021-08-25 | Discharge: 2021-08-25 | Disposition: A | Discharge status: Home / Self Care | Payer: Medicare HMO | Attending: Emergency Medicine | Admitting: Emergency Medicine

## 2021-08-25 ENCOUNTER — Encounter: Payer: Self-pay | Admitting: Emergency Medicine

## 2021-08-25 ENCOUNTER — Other Ambulatory Visit: Payer: Self-pay

## 2021-08-25 ENCOUNTER — Ambulatory Visit (INDEPENDENT_AMBULATORY_CARE_PROVIDER_SITE_OTHER): Payer: Medicare HMO

## 2021-08-25 DIAGNOSIS — R0602 Shortness of breath: Secondary | ICD-10-CM | POA: Diagnosis not present

## 2021-08-25 DIAGNOSIS — R059 Cough, unspecified: Secondary | ICD-10-CM

## 2021-08-25 DIAGNOSIS — R051 Acute cough: Secondary | ICD-10-CM

## 2021-08-25 DIAGNOSIS — R062 Wheezing: Secondary | ICD-10-CM

## 2021-08-25 DIAGNOSIS — R509 Fever, unspecified: Secondary | ICD-10-CM

## 2021-08-25 DIAGNOSIS — J441 Chronic obstructive pulmonary disease with (acute) exacerbation: Secondary | ICD-10-CM

## 2021-08-25 MED ORDER — PREDNISONE 10 MG (21) PO TBPK: ORAL_TABLET | Freq: Every day | ORAL | 0 refills | Status: DC

## 2021-08-25 MED ORDER — METHYLPREDNISOLONE SODIUM SUCC 125 MG IJ SOLR
125.0000 mg | Freq: Once | INTRAMUSCULAR | Status: AC
  Administered 2021-08-25: 125 mg via INTRAMUSCULAR

## 2021-08-25 MED ORDER — ALBUTEROL SULFATE (2.5 MG/3ML) 0.083% IN NEBU: 2.5000 mg | INHALATION_SOLUTION | Freq: Four times a day (QID) | RESPIRATORY_TRACT | 12 refills | Status: AC | PRN

## 2021-08-25 NOTE — Discharge Instructions (Addendum)
Your chest x-ray is negative for pneumonia You are having more of a COPD exacerbation from a viral illness If your symptoms become worse in the next 24 hours you will need to go to the emergency room Continue to use your albuterol and Symbicort inhaler We will give you some steroids to help open  Use a humidifier also help with breathing

## 2021-08-25 NOTE — ED Triage Notes (Signed)
Pt c/o cough, shortness of breath, chest congestion, chills, and sweats. Started about a week ago. Pt has known COPD.

## 2021-08-25 NOTE — ED Provider Notes (Signed)
[facility]    CSN: [id number] Arrival date & time: 08/25/21  0910      History   Chief Complaint Chief Complaint  Patient presents with   Cough   Shortness of Breath    HPI Eric Stanton is a 71 y.o. male.   Patient is here for cough congestion shortness of breath and fevers x1 week.  Patient is a COPD patient and has been using his albuterol rescue inhaler x3 a day and also takes his Symbicort with no relief.  Patient states that he does have intermittent pain with taking a deep breath.  He has thick green mucus or phlegm getting worse for the past 2.   Past Medical History:  Diagnosis Date   Colon polyps    COPD (chronic obstructive pulmonary disease) (HCC)    Diverticulosis    GERD (gastroesophageal reflux disease)    Macular degeneration of right eye     Patient Active Problem List   Diagnosis Date Noted   Macular degeneration of right eye 03/12/2021   Leukopenia 04/07/2020   Mild hyperlipidemia 02/09/2019   History of esophageal stricture 11/04/2018   COPD (chronic obstructive pulmonary disease) ([place]) 11/01/2018   GERD (gastroesophageal reflux disease) 11/01/2018    Past Surgical History:  Procedure Laterality Date   HERNIA REPAIR  2010       Home Medications    Prior to Admission medications   Medication Sig Start Date End Date Taking? Authorizing Provider  Ascorbic Acid (VITAMIN C) 1000 MG tablet Take 1,000 mg by mouth daily.   Yes [provider]  aspirin EC 81 MG tablet Take by mouth.   Yes [provider]  atorvastatin (LIPITOR) 10 MG tablet TAKE 1 TABLET BY MOUTH EVERY DAY 02/21/21  Yes Glean Hess, MD  docusate sodium (COLACE) 100 MG capsule Take by mouth.   Yes [provider]  famotidine (PEPCID) 20 MG tablet Take 20 mg by mouth daily.    Yes [provider]  fluticasone-salmeterol (ADVAIR HFA) 230-21 MCG/ACT inhaler Inhale 2 puffs into the lungs 2 (two) times daily. 03/07/20  Yes Glean Hess, MD  Ipratropium-Albuterol (COMBIVENT RESPIMAT) 20-100 MCG/ACT AERS respimat TAKE 1 PUFF INTO THE LUNGS FOUR TIMES A DAY AS NEEDED FOR WHEEZING 05/30/21  Yes Glean Hess, MD  Multiple Vitamins-Minerals (PRESERVISION AREDS 2+MULTI VIT PO) Take by mouth.   Yes [provider]  Omega-3 Fatty Acids (FISH OIL) 1000 MG CAPS Take by mouth.   Yes [provider]  predniSONE (STERAPRED UNI-PAK 21 TAB) 10 MG (21) TBPK tablet Take by mouth daily. Take 6 tabs by mouth daily  for 2 days, then 5 tabs for 2 days, then 4 tabs for 2 days, then 3 tabs for 2 days, 2 tabs for 2 days, then 1 tab by mouth daily for 2 days 08/25/21  Yes Marney Setting, NP  Saw Palmetto 1000 MG CAPS Take by mouth.   Yes [provider]  vitamin B-12 (CYANOCOBALAMIN) 1000 MCG tablet Take 1,000 mcg by mouth daily.   Yes [provider]    Family History Family History  Problem Relation Age of Onset   Liver cancer Mother    Encopresis Father    Cancer Maternal Aunt    Cancer Maternal Uncle     Social History Social History   Tobacco Use   Smoking status: Former    Packs/day: 2.00    Years: 20.00    Pack years: 40.00  Types: Cigarettes    Quit date: 21    Years since quitting: 38.0   Smokeless tobacco: Never   Tobacco comments:    Smoking cessation materials not required  Vaping Use   Vaping Use: Never used  Substance Use Topics   Alcohol use: Not Currently   Drug use: Never     Allergies   Codeine and Oxycodone-acetaminophen   Review of Systems Review of Systems  Constitutional:  Positive for activity change, chills and fever.  HENT:  Positive for congestion, postnasal drip, sinus pressure and sore throat.   Eyes: Negative.   Respiratory:  Positive for cough, chest tightness, shortness of breath and wheezing.   Gastrointestinal: Negative.  Negative for nausea.  Genitourinary: Negative.   Musculoskeletal: Negative.   Skin: Negative.   Neurological:  Negative.     Physical Exam Triage Vital Signs ED Triage Vitals [08/25/21 0937]  Enc Vitals Group     BP 132/85     Pulse Rate 96     Resp 20     Temp 98.4 F (36.9 C)     Temp Source Oral     SpO2 97 %     Weight 164 lb 0.4 oz (74.4 kg)     Height 5\' 10"  (1.778 m)     Head Circumference      Peak Flow      Pain Score 8     Pain Loc      Pain Edu?      Excl. in [place]?    No data found.  Updated Vital Signs BP 132/85 (BP Location: Left Arm)    Pulse 96    Temp 98.4 F (36.9 C) (Oral)    Resp 20    Ht 5\' 10"  (1.778 m)    Wt 164 lb 0.4 oz (74.4 kg)    SpO2 97%    BMI 23.53 kg/m   Visual Acuity Right Eye Distance:   Left Eye Distance:   Bilateral Distance:    Right Eye Near:   Left Eye Near:    Bilateral Near:     Physical Exam Constitutional:      General: He is in acute distress.     Appearance: He is not ill-appearing.  Pulmonary:     Effort: Tachypnea and accessory muscle usage present.     Breath sounds: Examination of the right-middle field reveals wheezing and rhonchi. Examination of the left-middle field reveals wheezing and rhonchi. Examination of the right-lower field reveals decreased breath sounds, wheezing and rhonchi. Examination of the left-lower field reveals decreased breath sounds, wheezing and rhonchi. Decreased breath sounds, wheezing and rhonchi present.  Chest:     Chest wall: No tenderness.  Abdominal:     General: Bowel sounds are normal.     Palpations: Abdomen is soft.  Musculoskeletal:        General: Normal range of motion.  Skin:    General: Skin is warm.     Capillary Refill: Capillary refill takes 2 to 3 seconds.  Neurological:     General: No focal deficit present.     Mental Status: He is alert.     UC Treatments / Results  Labs (all labs ordered are listed, but only abnormal results are displayed) Labs Reviewed - No data to display  EKG   Radiology DG Chest 2 View  Result Date: 08/25/2021 CLINICAL DATA:  Fever and  wheezing.  Cough and shortness of breath. EXAM: CHEST - 2 VIEW COMPARISON:  01/26/2012 FINDINGS:  Cardiac silhouette is normal in size and configuration. Normal mediastinal and hilar contours. Lungs are hyperexpanded, but clear. No pleural effusion or pneumothorax. Skeletal structures are intact. IMPRESSION: 1. No acute cardiopulmonary disease. 2. Hyperexpanded lungs suggests COPD. Electronically Signed   By: Lajean Manes M.D.   On: 08/25/2021 10:15    Procedures Procedures (including critical care time)  Medications Ordered in UC Medications  methylPREDNISolone sodium succinate (SOLU-MEDROL) 125 mg/2 mL injection 125 mg (125 mg Intramuscular Given 08/25/21 1002)    Initial Impression / Assessment and Plan / UC Course  I have reviewed the triage vital signs and the nursing notes.  Pertinent labs & imaging results that were available during my care of the patient were reviewed by me and considered in my medical decision making (see chart for details).     Your chest x-ray is negative for pneumonia You are having more of a COPD exacerbation from a viral illness If your symptoms become worse in the next 24 hours you will need to go to the emergency room Continue to use your albuterol and Symbicort inhaler We will give you some steroids to help open  Use a humidifier also help with breathing  Final Clinical Impressions(s) / UC Diagnoses   Final diagnoses:  COPD exacerbation (HCC)  Shortness of breath  Acute cough     Discharge Instructions      Your chest x-ray is negative for pneumonia You are having more of a COPD exacerbation from a viral illness If your symptoms become worse in the next 24 hours you will need to go to the emergency room Continue to use your albuterol and Symbicort inhaler We will give you some steroids to help open  Use a humidifier also help with breathing     ED Prescriptions     Medication Sig Dispense Auth. Provider   predniSONE (STERAPRED UNI-PAK  21 TAB) 10 MG (21) TBPK tablet Take by mouth daily. Take 6 tabs by mouth daily  for 2 days, then 5 tabs for 2 days, then 4 tabs for 2 days, then 3 tabs for 2 days, 2 tabs for 2 days, then 1 tab by mouth daily for 2 days 42 tablet Marney Setting, NP      PDMP not reviewed this encounter.   Marney Setting, NP 08/25/21 1026

## 2022-02-16 ENCOUNTER — Telehealth: Payer: Self-pay | Admitting: Internal Medicine

## 2022-02-16 NOTE — Telephone Encounter (Signed)
Pts wife is calling to report that the pt is no longer a pt of [facility]. And to cancel the Wednesday appt with the Nurse.

## 2022-02-18 ENCOUNTER — Ambulatory Visit: Payer: Medicare HMO

## 2022-03-01 IMAGING — CR DG CHEST 2V
3 series · 3 of 3 positions shown · non-contrast
Comparison: 01/26/2012

CLINICAL DATA: Fever and wheezing.  Cough and shortness of breath.

EXAM:
CHEST - 2 VIEW

[chest pa (1 of 2)]
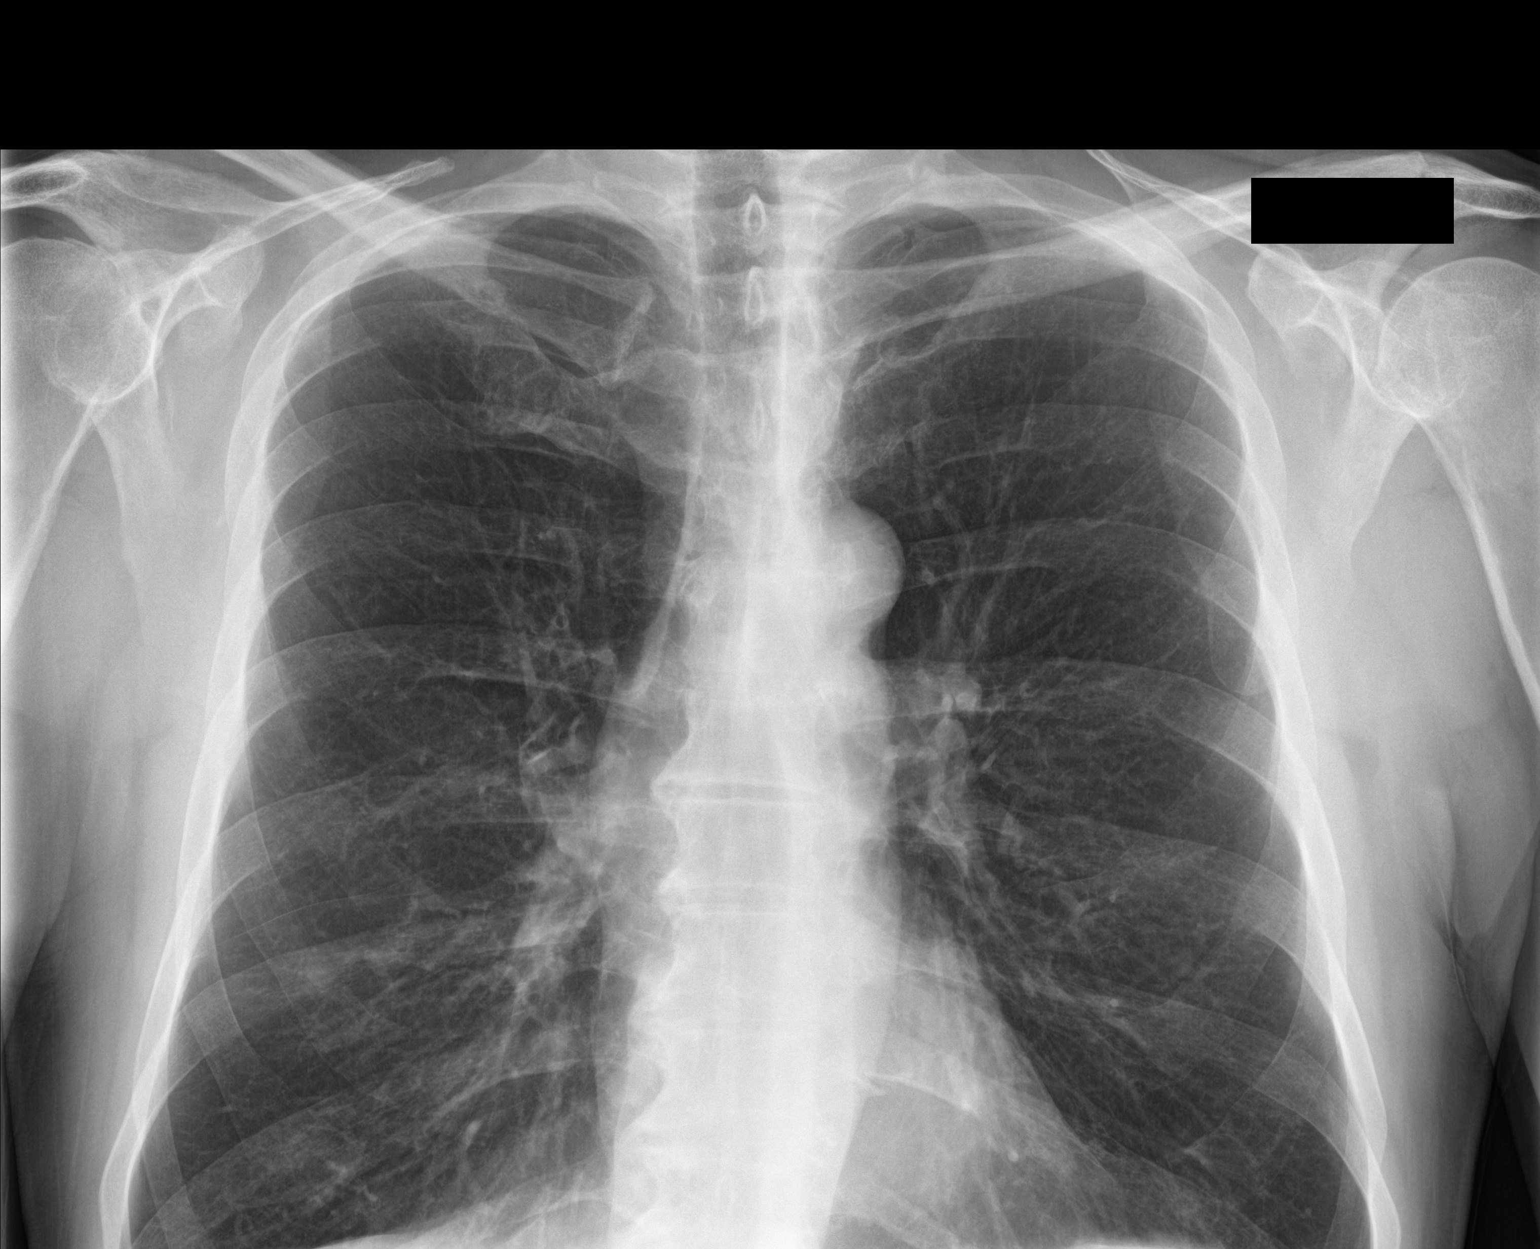

[chest lat]
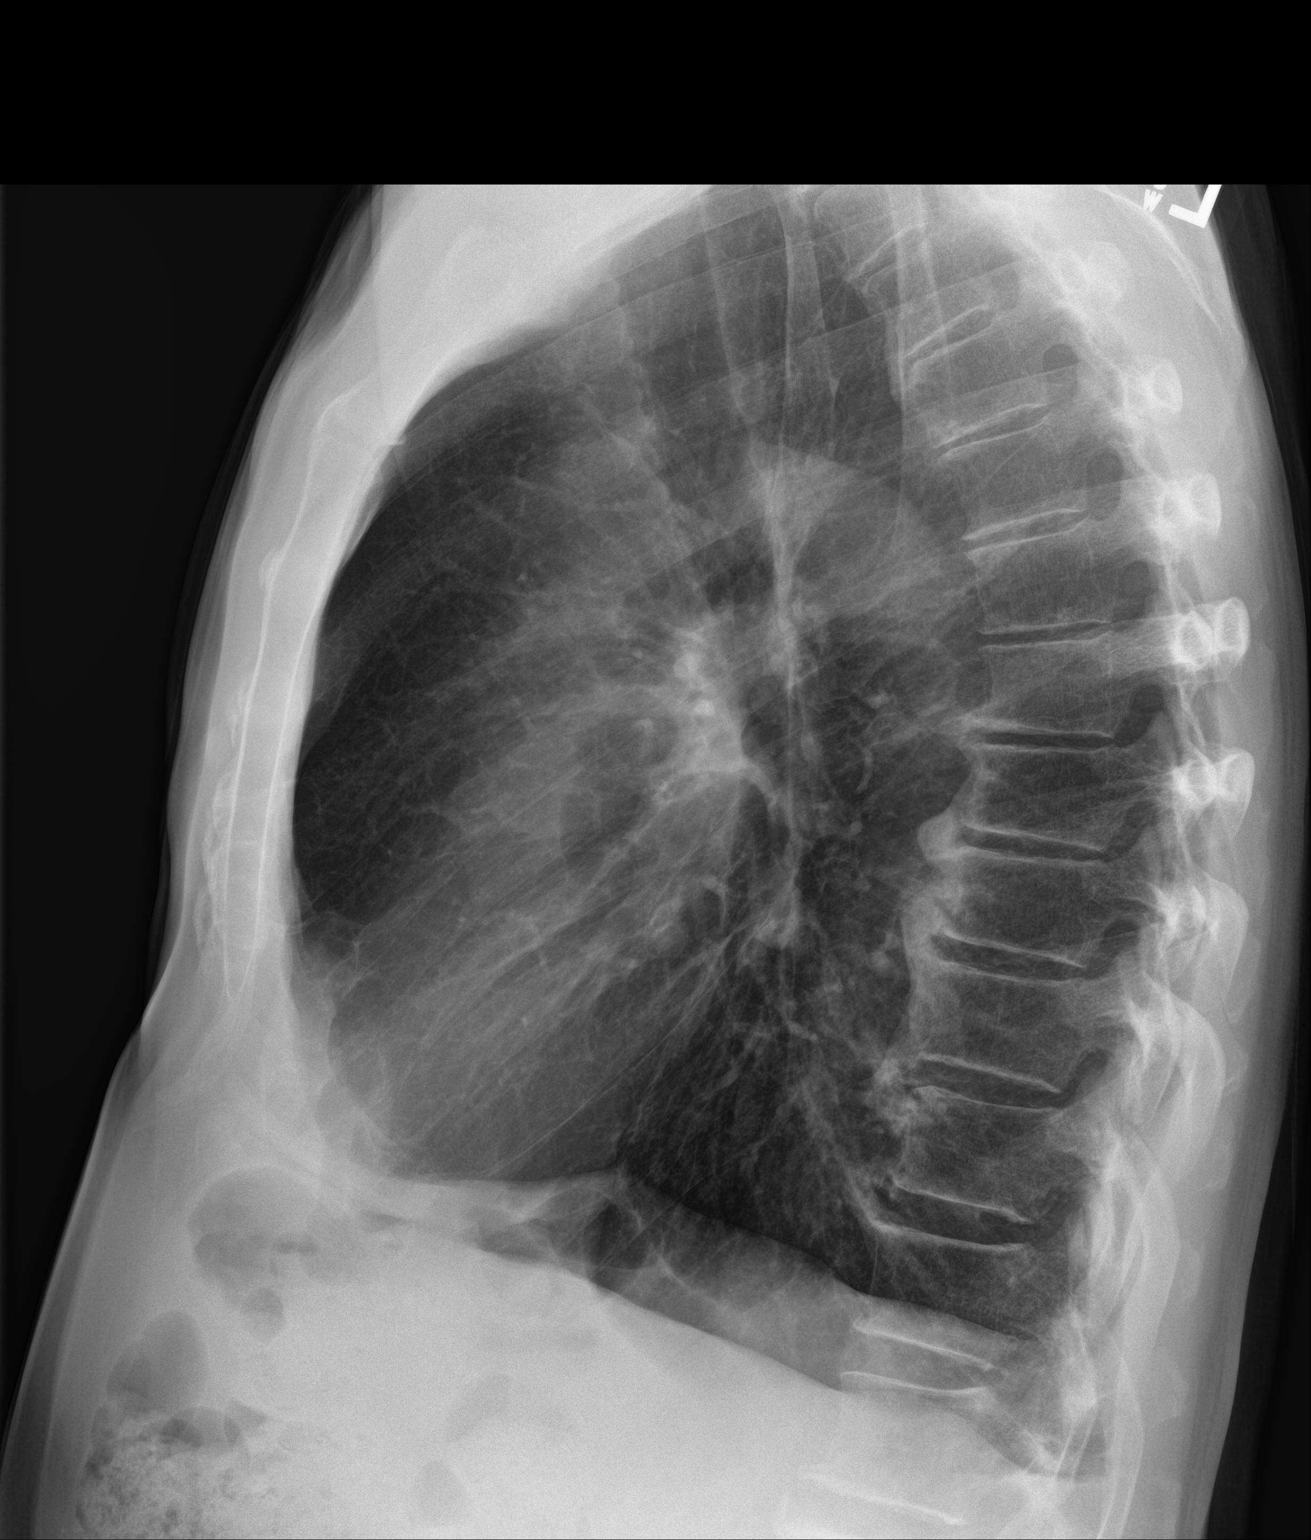

[chest pa (2 of 2)]
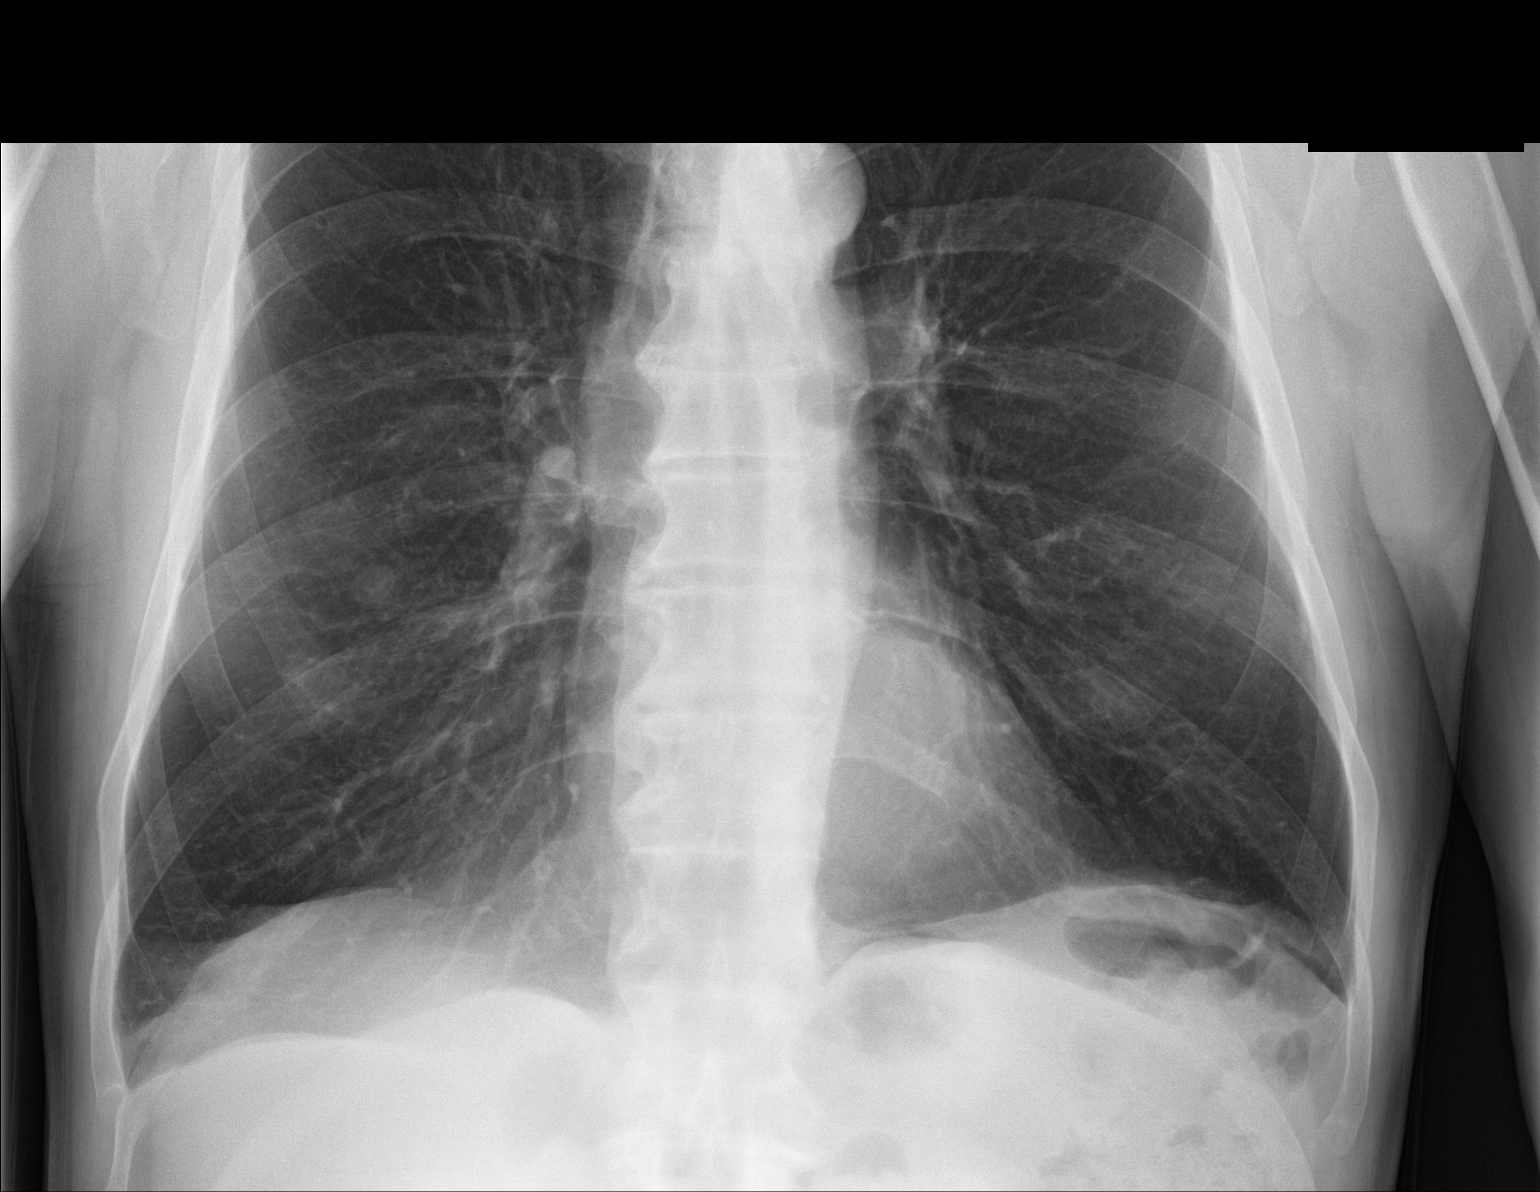

[3 of 3 positions shown; findings below may reference images not displayed]

FINDINGS: Cardiac silhouette is normal in size and configuration. Normal
mediastinal and hilar contours.

Lungs are hyperexpanded, but clear.

No pleural effusion or pneumothorax.

Skeletal structures are intact.
IMPRESSION: 1. No acute cardiopulmonary disease.
2. Hyperexpanded lungs suggests COPD.

## 2022-03-16 ENCOUNTER — Encounter: Payer: Medicare HMO | Admitting: Internal Medicine

## 2024-05-12 ENCOUNTER — Encounter: Payer: Self-pay | Admitting: Internal Medicine

## 2024-05-12 ENCOUNTER — Inpatient Hospital Stay: Admitting: Nurse Practitioner

## 2024-05-12 ENCOUNTER — Encounter: Payer: Self-pay | Admitting: Family Medicine

## 2024-05-12 ENCOUNTER — Inpatient Hospital Stay: Attending: Internal Medicine | Admitting: Internal Medicine

## 2024-05-12 VITALS — BP 111/75 | HR 97 | Temp 97.9°F | Resp 16 | Wt 152.0 lb

## 2024-05-12 DIAGNOSIS — D61818 Other pancytopenia: Secondary | ICD-10-CM

## 2024-05-12 DIAGNOSIS — Z79899 Other long term (current) drug therapy: Secondary | ICD-10-CM | POA: Diagnosis not present

## 2024-05-12 DIAGNOSIS — D696 Thrombocytopenia, unspecified: Secondary | ICD-10-CM | POA: Insufficient documentation

## 2024-05-12 DIAGNOSIS — D72819 Decreased white blood cell count, unspecified: Secondary | ICD-10-CM | POA: Diagnosis not present

## 2024-05-12 DIAGNOSIS — Z7951 Long term (current) use of inhaled steroids: Secondary | ICD-10-CM | POA: Insufficient documentation

## 2024-05-12 LAB — HEPATITIS B CORE ANTIBODY, IGM: Hep B C IgM: NONREACTIVE

## 2024-05-12 LAB — CBC WITH DIFFERENTIAL/PLATELET
Abs Immature Granulocytes: 0.02 K/uL (ref 0.00–0.07)
Basophils Absolute: 0 K/uL (ref 0.0–0.1)
Basophils Relative: 1 %
Eosinophils Absolute: 0 K/uL (ref 0.0–0.5)
Eosinophils Relative: 2 %
HCT: 34 % — ABNORMAL LOW (ref 39.0–52.0)
Hemoglobin: 11.3 g/dL — ABNORMAL LOW (ref 13.0–17.0)
Immature Granulocytes: 1 %
Lymphocytes Relative: 28 %
Lymphs Abs: 0.7 K/uL (ref 0.7–4.0)
MCH: 33.7 pg (ref 26.0–34.0)
MCHC: 33.2 g/dL (ref 30.0–36.0)
MCV: 101.5 fL — ABNORMAL HIGH (ref 80.0–100.0)
Monocytes Absolute: 0.1 K/uL (ref 0.1–1.0)
Monocytes Relative: 6 %
Neutro Abs: 1.5 K/uL — ABNORMAL LOW (ref 1.7–7.7)
Neutrophils Relative %: 62 %
Platelets: 149 K/uL — ABNORMAL LOW (ref 150–400)
RBC: 3.35 MIL/uL — ABNORMAL LOW (ref 4.22–5.81)
RDW: 14 % (ref 11.5–15.5)
Smear Review: NORMAL
WBC: 2.4 K/uL — ABNORMAL LOW (ref 4.0–10.5)
nRBC: 0 % (ref 0.0–0.2)

## 2024-05-12 LAB — COMPREHENSIVE METABOLIC PANEL WITH GFR
ALT: 7 U/L (ref 0–44)
AST: 12 U/L — ABNORMAL LOW (ref 15–41)
Albumin: 4.3 g/dL (ref 3.5–5.0)
Alkaline Phosphatase: 72 U/L (ref 38–126)
Anion gap: 7 (ref 5–15)
BUN: 12 mg/dL (ref 8–23)
CO2: 25 mmol/L (ref 22–32)
Calcium: 8.6 mg/dL — ABNORMAL LOW (ref 8.9–10.3)
Chloride: 104 mmol/L (ref 98–111)
Creatinine, Ser: 0.96 mg/dL (ref 0.61–1.24)
GFR, Estimated: >60 mL/min (ref >60)
Glucose, Bld: 101 mg/dL — ABNORMAL HIGH (ref 70–99)
Potassium: 4.3 mmol/L (ref 3.5–5.1)
Sodium: 136 mmol/L (ref 135–145)
Total Bilirubin: 0.8 mg/dL (ref 0.0–1.2)
Total Protein: 6.9 g/dL (ref 6.5–8.1)

## 2024-05-12 LAB — RETICULOCYTES
Immature Retic Fract: 14.6 % (ref 2.3–15.9)
RBC.: 3.34 MIL/uL — ABNORMAL LOW (ref 4.22–5.81)
Retic Count, Absolute: 53.8 K/uL (ref 19.0–186.0)
Retic Ct Pct: 1.6 % (ref 0.4–3.1)

## 2024-05-12 LAB — FERRITIN: Ferritin: 34 ng/mL (ref 24–336)

## 2024-05-12 LAB — IRON AND TIBC
Iron: 139 ug/dL (ref 45–182)
Saturation Ratios: 35 % (ref 17.9–39.5)
TIBC: 393 ug/dL (ref 250–450)
UIBC: 254 ug/dL

## 2024-05-12 LAB — LACTATE DEHYDROGENASE: LDH: 92 U/L — ABNORMAL LOW (ref 98–192)

## 2024-05-12 LAB — HEPATITIS C ANTIBODY: HCV Ab: NONREACTIVE

## 2024-05-12 LAB — HIV ANTIBODY (ROUTINE TESTING W REFLEX): HIV Screen 4th Generation wRfx: NONREACTIVE

## 2024-05-12 LAB — IMMATURE PLATELET FRACTION: Immature Platelet Fraction: 2 % (ref 1.2–8.6)

## 2024-05-12 LAB — HEPATITIS B SURFACE ANTIGEN: Hepatitis B Surface Ag: NONREACTIVE

## 2024-05-12 LAB — FOLATE: Folate: >20 ng/mL (ref >5.9)

## 2024-05-12 LAB — VITAMIN B12: Vitamin B-12: 768 pg/mL (ref 180–914)

## 2024-05-12 NOTE — Progress Notes (Signed)
 [facility] CONSULT NOTE  Patient Care Team: Adina Buel HERO, MD as PCP - General (Family Medicine) Arloa Mliss RAMAN, [facility] (Inactive) (Pharmacist) Myrna Adine Anes, MD as Consulting Physician (Ophthalmology) Rennie Cindy SAUNDERS, MD as Consulting Physician (Oncology)  CHIEF COMPLAINTS/PURPOSE OF CONSULTATION: Pancytopenia   HEMATOLOGY HISTORY  # PANCYTOPENIA [platelets-  WBC-  ANC-; Hb- MCV- HIV/Hepatitis: Alcohol; CT: US :   HISTORY OF PRESENTING ILLNESS: Patient ambulating-independently.Accompanied by  daughter, Eric Stanton.  Eric Stanton 73 y.o.  male pleasant patient was been referred to us  for further evaluation of pancytopenia.   No prior history of any hematologic disorder/or prior bone marrow biopsies.  Lost 30 pounds- over 2 years; none in last 6 months.   Alcohol: very little- once year Hepatitis/liver- none YPC:[id number] Auto-immune disease: none Herbal medications/ new medications:none Hx of malignancy:  CT scan: US :    Review of Systems  Constitutional:  Negative for chills, diaphoresis, fever, malaise/fatigue and weight loss.  HENT:  Negative for nosebleeds and sore throat.   Eyes:  Negative for double vision.  Respiratory:  Negative for cough, hemoptysis, sputum production, shortness of breath and wheezing.   Cardiovascular:  Negative for chest pain, palpitations, orthopnea and leg swelling.  Gastrointestinal:  Negative for abdominal pain, blood in stool, constipation, diarrhea, heartburn, melena, nausea and vomiting.  Genitourinary:  Negative for dysuria, frequency and urgency.  Musculoskeletal:  Positive for joint pain. Negative for back pain.  Skin: Negative.  Negative for itching and rash.  Neurological:  Negative for dizziness, tingling, focal weakness, weakness and headaches.  Endo/Heme/Allergies:  Does not bruise/bleed easily.  Psychiatric/Behavioral:  Negative for depression. The patient is not nervous/anxious and does not have insomnia.       MEDICAL HISTORY:  Past Medical History:  Diagnosis Date   Colon polyps    COPD (chronic obstructive pulmonary disease) (HCC)    Diverticulosis    GERD (gastroesophageal reflux disease)    Macular degeneration of right eye     SURGICAL HISTORY: Past Surgical History:  Procedure Laterality Date   HERNIA REPAIR  2010    SOCIAL HISTORY: Social History   Socioeconomic History   Marital status: Married    Spouse name: Not on file   Number of children: 5   Years of education: Not on file   Highest education level: 10th grade  Occupational History   Occupation: retired  Tobacco Use   Smoking status: Former    Current packs/day: 0.00    Average packs/day: 2.0 packs/day for 20.0 years (40.0 ttl pk-yrs)    Types: Cigarettes    Start date: 29    Quit date: 1985    Years since quitting: 40.7   Smokeless tobacco: Never   Tobacco comments:    Smoking cessation materials not required  Vaping Use   Vaping status: Never Used  Substance and Sexual Activity   Alcohol use: Not Currently   Drug use: Never   Sexual activity: Yes  Other Topics Concern   Not on file  Social History Narrative   Not on file   Social Drivers of Health   Financial Resource Strain: Low Risk  (05/12/2024)   Overall Financial Resource Strain (CARDIA)    Difficulty of Paying Living Expenses: Not very hard  Food Insecurity: No Food Insecurity (05/12/2024)   Hunger Vital Sign    Worried About Running Out of Food in the Last Year: Never true    Ran Out of Food in the Last Year: Never true  Transportation Needs:  No Transportation Needs (05/12/2024)   PRAPARE - Administrator, Civil Service (Medical): No    Lack of Transportation (Non-Medical): No  Physical Activity: Inactive (05/12/2024)   Exercise Vital Sign    Days of Exercise per Week: 0 days    Minutes of Exercise per Session: 0 min  Stress: No Stress Concern Present (05/12/2024)   [employer] of Occupational Health -  Occupational Stress Questionnaire    Feeling of Stress: Not at all  Social Connections: Moderately Integrated (05/12/2024)   Social Connection and Isolation Panel    Frequency of Communication with Friends and Family: More than three times a week    Frequency of Social Gatherings with Friends and Family: Three times a week    Attends Religious Services: More than 4 times per year    Active Member of Clubs or Organizations: No    Attends Banker Meetings: Never    Marital Status: Married  Catering manager Violence: Not At Risk (05/12/2024)   Humiliation, Afraid, Rape, and Kick questionnaire    Fear of Current or Ex-Partner: No    Emotionally Abused: No    Physically Abused: No    Sexually Abused: No    FAMILY HISTORY: Family History  Problem Relation Age of Onset   Liver cancer Mother    Encopresis Father    COPD Father    Cancer Maternal Aunt    Cancer Maternal Uncle     ALLERGIES:  is allergic to codeine and oxycodone-acetaminophen.  MEDICATIONS:  Current Outpatient Medications  Medication Sig Dispense Refill   albuterol  (PROVENTIL ) (2.5 MG/3ML) 0.083% nebulizer solution Take 3 mLs (2.5 mg total) by nebulization every 6 (six) hours as needed for wheezing or shortness of breath. 75 mL 12   Ascorbic Acid (VITAMIN C) 1000 MG tablet Take 1,000 mg by mouth daily.     aspirin EC 81 MG tablet Take by mouth.     atorvastatin  (LIPITOR) 10 MG tablet TAKE 1 TABLET BY MOUTH EVERY DAY 90 tablet 2   docusate sodium (COLACE) 100 MG capsule Take by mouth.     famotidine (PEPCID) 20 MG tablet Take 20 mg by mouth daily.      fluticasone -salmeterol (ADVAIR HFA) 230-21 MCG/ACT inhaler Inhale 2 puffs into the lungs 2 (two) times daily. 1 Inhaler 12   Ipratropium-Albuterol  (COMBIVENT  RESPIMAT) 20-100 MCG/ACT AERS respimat TAKE 1 PUFF INTO THE LUNGS FOUR TIMES A DAY AS NEEDED FOR WHEEZING 12 g 12   Multiple Vitamins-Minerals (PRESERVISION AREDS 2+MULTI VIT PO) Take by mouth.     Saw  Palmetto 1000 MG CAPS Take by mouth.     vitamin D3 (CHOLECALCIFEROL) 25 MCG tablet Take 1,000 Units by mouth daily.     Omega-3 Fatty Acids (FISH OIL) 1000 MG CAPS Take by mouth. (Patient not taking: Reported on 05/12/2024)     predniSONE  (STERAPRED UNI-PAK 21 TAB) 10 MG (21) TBPK tablet Take by mouth daily. Take 6 tabs by mouth daily  for 2 days, then 5 tabs for 2 days, then 4 tabs for 2 days, then 3 tabs for 2 days, 2 tabs for 2 days, then 1 tab by mouth daily for 2 days 42 tablet 0   vitamin B-12 (CYANOCOBALAMIN) 1000 MCG tablet Take 1,000 mcg by mouth daily.     No current facility-administered medications for this visit.     PHYSICAL EXAMINATION:   Vitals:   05/12/24 1106  BP: 111/75  Pulse: 97  Resp: 16  Temp: 97.9 F (  36.6 C)  SpO2: 99%   Filed Weights   05/12/24 1106  Weight: 152 lb (68.9 kg)    Splenomegaly   Physical Exam Vitals and nursing note reviewed.  HENT:     Head: Normocephalic and atraumatic.     Mouth/Throat:     Pharynx: Oropharynx is clear.  Eyes:     Extraocular Movements: Extraocular movements intact.     Pupils: Pupils are equal, round, and reactive to light.  Cardiovascular:     Rate and Rhythm: Normal rate and regular rhythm.  Pulmonary:     Comments: Decreased breath sounds bilaterally.  Abdominal:     Palpations: Abdomen is soft.  Musculoskeletal:        General: Normal range of motion.     Cervical back: Normal range of motion.  Skin:    General: Skin is warm.  Neurological:     General: No focal deficit present.     Mental Status: He is alert and oriented to person, place, and time.  Psychiatric:        Behavior: Behavior normal.        Judgment: Judgment normal.      LABORATORY DATA:  I have reviewed the data as listed Lab Results  Component Value Date   WBC 2.4 (L) 05/12/2024   HGB 11.3 (L) 05/12/2024   HCT 34.0 (L) 05/12/2024   MCV 101.5 (H) 05/12/2024   PLT 149 (L) 05/12/2024   Recent Labs    05/12/24 1211  NA  136  K 4.3  CL 104  CO2 25  GLUCOSE 101*  BUN 12  CREATININE 0.96  CALCIUM  8.6*  GFRNONAA >60  PROT 6.9  ALBUMIN 4.3  AST 12*  ALT 7  ALKPHOS 72  BILITOT 0.8     No results found.  ASSESSMENT & PLAN:   Other pancytopenia (HCC) # PANCYTOPENIA-[SEP 2025; leukopenia [Neutrophils:1.2; neutropenia also noted in 2022-with normal platelets and hemoglobin:]hb 11.2; platelets 133.  The etiology is unclear. Splenomegaly?  # Interestingly patient has been asymptomatic from his underlying pancytopenia-no obvious alcohol or liver disease noted.   #I reviewed with the patient and family the multiple causes of pancytopenia include-benign causes like liver disease/hypersplenism; vitamin deficiencies--including B 12 folic acid; viral infections-HIV/hepatitis; autoimmune causes; medications etc. I also reviewed malignant causes include: leukemias-MDS/lymphomas  and rare infiltration of the bone marrow with carcinomas.    # Recommend blood work-CBC CMP LDH hepatitis panel;HIV; iron studies ferritin; reticulocyte count review of peripheral smear; B12 folate acid.  Also will order imaging with ultrasound. Discussed the possible need for a bone marrow biopsy if above work-up is unrevealing; all labs are progressively getting worse.    Thank you-Dr.Adamo MD  for allowing me to participate in the care of your pleasant patient. Please do not hesitate to contact me with questions or concerns in the interim.  # DISPOSITION: # labs today- ordered # US  abdomen ASAP # follow up in 2 week/Friday- 9:45; MD; No labs- Dr.B   All questions were answered. The patient knows to call the clinic with any problems, questions or concerns.   Cindy JONELLE Joe, MD 05/12/2024 1:08 PM

## 2024-05-12 NOTE — Assessment & Plan Note (Addendum)
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  The etiology is unclear. Splenomegaly?  # Interestingly patient has been asymptomatic from his underlying pancytopenia-no obvious alcohol or liver disease noted.   #I reviewed with the patient and family the multiple causes of pancytopenia include-benign causes like liver disease/hypersplenism; vitamin deficiencies--including B 12 folic acid; viral infections-HIV/hepatitis; autoimmune causes; medications etc. I also reviewed malignant causes include: leukemias-MDS/lymphomas  and rare infiltration of the bone marrow with carcinomas.    # Recommend blood work-CBC CMP LDH hepatitis panel;HIV; iron studies ferritin; reticulocyte count review of peripheral smear; B12 folate acid.  Also will order imaging with ultrasound. Discussed the possible need for a bone marrow biopsy if above work-up is unrevealing; all labs are progressively getting worse.    Thank you-Dr.Adamo MD  for allowing me to participate in the care of your pleasant patient. Please do not hesitate to contact me with questions or concerns in the interim.  # DISPOSITION: # labs today- ordered # US  abdomen ASAP # follow up in 2 week/Friday- 9:45; MD; No labs- Dr.B

## 2024-05-12 NOTE — Progress Notes (Signed)
 New referral for pancytopenia fro Dr Adina.

## 2024-05-14 LAB — HAPTOGLOBIN: Haptoglobin: 104 mg/dL (ref 34–355)

## 2024-05-15 ENCOUNTER — Encounter: Payer: Self-pay | Admitting: Family Medicine

## 2024-05-15 ENCOUNTER — Ambulatory Visit
Admission: RE | Admit: 2024-05-15 | Discharge: 2024-05-15 | Disposition: A | Discharge status: Home / Self Care | Source: Ambulatory Visit | Attending: Internal Medicine | Admitting: Internal Medicine

## 2024-05-15 ENCOUNTER — Telehealth: Payer: Self-pay | Admitting: *Deleted

## 2024-05-15 DIAGNOSIS — D61818 Other pancytopenia: Secondary | ICD-10-CM | POA: Insufficient documentation

## 2024-05-15 LAB — KAPPA/LAMBDA LIGHT CHAINS
Kappa free light chain: 24 mg/L — ABNORMAL HIGH (ref 3.3–19.4)
Kappa, lambda light chain ratio: 0.26 (ref 0.26–1.65)
Lambda free light chains: 91.1 mg/L — ABNORMAL HIGH (ref 5.7–26.3)

## 2024-05-15 NOTE — Telephone Encounter (Signed)
 Printed the ultrasound and gave it to Alfonso so as soon as he gets out of the room of the patient he is with she will give him the papers to see what needs to be done

## 2024-05-16 LAB — COMP PANEL: LEUKEMIA/LYMPHOMA

## 2024-05-26 ENCOUNTER — Encounter: Payer: Self-pay | Admitting: Internal Medicine

## 2024-05-26 ENCOUNTER — Inpatient Hospital Stay: Attending: Internal Medicine | Admitting: Internal Medicine

## 2024-05-26 VITALS — BP 131/84 | HR 90 | Temp 97.9°F | Resp 16 | Ht 70.0 in | Wt 152.8 lb

## 2024-05-26 DIAGNOSIS — D61818 Other pancytopenia: Secondary | ICD-10-CM | POA: Diagnosis not present

## 2024-05-26 NOTE — Assessment & Plan Note (Addendum)
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 US  sep 2025- Diffuse hepatic steatosis. No morphologic changes of cirrhosis, visualized, at this time;  No splenomegaly. SEP 2025- vitamin deficiencies--including B 12 folic acid; viral infections-HIV/hepatitis- WNL. ;   # Interestingly patient has been asymptomatic from his underlying pancytopenia-no obvious alcohol or liver disease noted-however noted slow progressive over the last 2 to 3 years.  Would recommend further evaluation with a bone marrow biopsy.  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  # Discussed with the patient the bone marrow biopsy and aspiration indication and procedure at length.  Given significant discomfort involved-I would recommend under sedation/with interventional radiology. I discussed the potential complications include-bleeding/trauma and risk of infection; which are fortunately very rare.  Patient is in agreement. Patient will sign the consent prior to the procedure. Bone marrow biopsy/aspiration is ordered.   #Incidental findings on Imaging US   , 2025:  Nodular echogenicity in the gallbladder fundus, measuring 1.5 x 1.1 x 0.8 cm. This could represent adherent biliary sludge, gallbladder polyp, or focal adenomyomatosis. Nonemergent multiphase abdominal MRI with IV contrast I reviewed/discussed/counseled the patient. Will plan MRI down the line.   # DISPOSITION: # bone marrow biopsy- in 1-2 weeks-  # follow up  - 3 weeks  after the bone marrow- - MD; No labs- Dr.B

## 2024-05-26 NOTE — Progress Notes (Signed)
 [facility] CONSULT NOTE  Patient Care Team: Adina Buel HERO, MD as PCP - General (Family Medicine) Myrna Adine Anes, MD as Consulting Physician (Ophthalmology) Rennie Cindy SAUNDERS, MD as Consulting Physician (Oncology)  CHIEF COMPLAINTS/PURPOSE OF CONSULTATION: Pancytopenia   HEMATOLOGY HISTORY  # PANCYTOPENIA [platelets-  WBC-  ANC-; Hb- MCV- HIV/Hepatitis: Alcohol; CT: US :   HISTORY OF PRESENTING ILLNESS: Patient ambulating-independently.Accompanied by  daughter, Eric Stanton.  Eric Stanton 73 y.o.  male pleasant patient is here for a follow up  pancytopenia/ review work up.    Patient denies any abdominal pain nausea vomiting.  Patient has Lost 30 pounds- over 2 years; none in last 6 months.    Review of Systems  Constitutional:  Negative for chills, diaphoresis, fever, malaise/fatigue and weight loss.  HENT:  Negative for nosebleeds and sore throat.   Eyes:  Negative for double vision.  Respiratory:  Negative for cough, hemoptysis, sputum production, shortness of breath and wheezing.   Cardiovascular:  Negative for chest pain, palpitations, orthopnea and leg swelling.  Gastrointestinal:  Negative for abdominal pain, blood in stool, constipation, diarrhea, heartburn, melena, nausea and vomiting.  Genitourinary:  Negative for dysuria, frequency and urgency.  Musculoskeletal:  Positive for joint pain. Negative for back pain.  Skin: Negative.  Negative for itching and rash.  Neurological:  Negative for dizziness, tingling, focal weakness, weakness and headaches.  Endo/Heme/Allergies:  Does not bruise/bleed easily.  Psychiatric/Behavioral:  Negative for depression. The patient is not nervous/anxious and does not have insomnia.      MEDICAL HISTORY:  Past Medical History:  Diagnosis Date   Colon polyps    COPD (chronic obstructive pulmonary disease) (HCC)    Diverticulosis    GERD (gastroesophageal reflux disease)    Macular degeneration of right eye      SURGICAL HISTORY: Past Surgical History:  Procedure Laterality Date   HERNIA REPAIR  2010    SOCIAL HISTORY: Social History   Socioeconomic History   Marital status: Married    Spouse name: Not on file   Number of children: 5   Years of education: Not on file   Highest education level: 10th grade  Occupational History   Occupation: retired  Tobacco Use   Smoking status: Former    Current packs/day: 0.00    Average packs/day: 2.0 packs/day for 20.0 years (40.0 ttl pk-yrs)    Types: Cigarettes    Start date: 72    Quit date: 1985    Years since quitting: 40.7   Smokeless tobacco: Never   Tobacco comments:    Smoking cessation materials not required  Vaping Use   Vaping status: Never Used  Substance and Sexual Activity   Alcohol use: Not Currently   Drug use: Never   Sexual activity: Yes  Other Topics Concern   Not on file  Social History Narrative   Not on file   Social Drivers of Health   Financial Resource Strain: Low Risk  (05/12/2024)   Overall Financial Resource Strain (CARDIA)    Difficulty of Paying Living Expenses: Not very hard  Food Insecurity: No Food Insecurity (05/12/2024)   Hunger Vital Sign    Worried About Running Out of Food in the Last Year: Never true    Ran Out of Food in the Last Year: Never true  Transportation Needs: No Transportation Needs (05/12/2024)   PRAPARE - Administrator, Civil Service (Medical): No    Lack of Transportation (Non-Medical): No  Physical Activity: Inactive (05/12/2024)  Exercise Vital Sign    Days of Exercise per Week: 0 days    Minutes of Exercise per Session: 0 min  Stress: No Stress Concern Present (05/12/2024)   [employer] of Occupational Health - Occupational Stress Questionnaire    Feeling of Stress: Not at all  Social Connections: Moderately Integrated (05/12/2024)   Social Connection and Isolation Panel    Frequency of Communication with Friends and Family: More than three times a  week    Frequency of Social Gatherings with Friends and Family: Three times a week    Attends Religious Services: More than 4 times per year    Active Member of Clubs or Organizations: No    Attends Banker Meetings: Never    Marital Status: Married  Catering manager Violence: Not At Risk (05/12/2024)   Humiliation, Afraid, Rape, and Kick questionnaire    Fear of Current or Ex-Partner: No    Emotionally Abused: No    Physically Abused: No    Sexually Abused: No    FAMILY HISTORY: Family History  Problem Relation Age of Onset   Liver cancer Mother    Encopresis Father    COPD Father    Cancer Maternal Aunt    Cancer Maternal Uncle     ALLERGIES:  is allergic to codeine and oxycodone-acetaminophen.  MEDICATIONS:  Current Outpatient Medications  Medication Sig Dispense Refill   albuterol  (PROVENTIL ) (2.5 MG/3ML) 0.083% nebulizer solution Take 3 mLs (2.5 mg total) by nebulization every 6 (six) hours as needed for wheezing or shortness of breath. 75 mL 12   Ascorbic Acid (VITAMIN C) 1000 MG tablet Take 1,000 mg by mouth daily.     aspirin EC 81 MG tablet Take by mouth.     atorvastatin  (LIPITOR) 10 MG tablet TAKE 1 TABLET BY MOUTH EVERY DAY 90 tablet 2   docusate sodium (COLACE) 100 MG capsule Take by mouth.     famotidine (PEPCID) 20 MG tablet Take 20 mg by mouth daily.      fluticasone -salmeterol (ADVAIR HFA) 230-21 MCG/ACT inhaler Inhale 2 puffs into the lungs 2 (two) times daily. 1 Inhaler 12   Ipratropium-Albuterol  (COMBIVENT  RESPIMAT) 20-100 MCG/ACT AERS respimat TAKE 1 PUFF INTO THE LUNGS FOUR TIMES A DAY AS NEEDED FOR WHEEZING 12 g 12   Multiple Vitamins-Minerals (PRESERVISION AREDS 2+MULTI VIT PO) Take by mouth.     Saw Palmetto 1000 MG CAPS Take by mouth.     vitamin B-12 (CYANOCOBALAMIN) 1000 MCG tablet Take 1,000 mcg by mouth daily.     vitamin D3 (CHOLECALCIFEROL) 25 MCG tablet Take 1,000 Units by mouth daily.     No current facility-administered  medications for this visit.     PHYSICAL EXAMINATION:   Vitals:   05/26/24 0940  BP: 131/84  Pulse: 90  Resp: 16  Temp: 97.9 F (36.6 C)  SpO2: 100%   Filed Weights   05/26/24 0940  Weight: 152 lb 12.8 oz (69.3 kg)    Splenomegaly   Physical Exam Vitals and nursing note reviewed.  HENT:     Head: Normocephalic and atraumatic.     Mouth/Throat:     Pharynx: Oropharynx is clear.  Eyes:     Extraocular Movements: Extraocular movements intact.     Pupils: Pupils are equal, round, and reactive to light.  Cardiovascular:     Rate and Rhythm: Normal rate and regular rhythm.  Pulmonary:     Comments: Decreased breath sounds bilaterally.  Abdominal:     Palpations: Abdomen  is soft.  Musculoskeletal:        General: Normal range of motion.     Cervical back: Normal range of motion.  Skin:    General: Skin is warm.  Neurological:     General: No focal deficit present.     Mental Status: He is alert and oriented to person, place, and time.  Psychiatric:        Behavior: Behavior normal.        Judgment: Judgment normal.      LABORATORY DATA:  I have reviewed the data as listed Lab Results  Component Value Date   WBC 2.4 (L) 05/12/2024   HGB 11.3 (L) 05/12/2024   HCT 34.0 (L) 05/12/2024   MCV 101.5 (H) 05/12/2024   PLT 149 (L) 05/12/2024   Recent Labs    05/12/24 1211  NA 136  K 4.3  CL 104  CO2 25  GLUCOSE 101*  BUN 12  CREATININE 0.96  CALCIUM  8.6*  GFRNONAA >60  PROT 6.9  ALBUMIN 4.3  AST 12*  ALT 7  ALKPHOS 72  BILITOT 0.8     US  Abdomen Complete Result Date: 05/15/2024 CLINICAL DATA:  cirrhosis/splenomegaly EXAM: ABDOMEN ULTRASOUND COMPLETE COMPARISON:  None Available. FINDINGS: Gallbladder: No gallstones. No pericholecystic fluid. Nodular echogenicity along the gallbladder fundus, measuring 1.5 x 1.1 x 0.8 cm. No sonographic Murphy's sign noted by sonographer. Common bile duct: Diameter: 3 mm Liver: Increased echogenicity. No focal lesion  identified. No intrahepatic biliary ductal dilation. Portal vein is patent on color Doppler imaging with normal direction of blood flow towards the liver. IVC: No abnormality visualized. Pancreas: Visualized portion unremarkable. Spleen: Size and appearance within normal limits. Right Kidney: Length: 10.5 cm. Normal echogenicity. No mass. No hydronephrosis or nephrolithiasis. Left Kidney: Length: 10.3 cm. Normal echogenicity. No mass. No hydronephrosis or nephrolithiasis. Abdominal aorta: No aneurysm visualized. Other findings: None. IMPRESSION: 1. Diffuse hepatic steatosis. No morphologic changes of cirrhosis, visualized, at this time. 2. No splenomegaly. 3. Nodular echogenicity in the gallbladder fundus, measuring 1.5 x 1.1 x 0.8 cm. This could represent adherent biliary sludge, gallbladder polyp, or focal adenomyomatosis. Nonemergent multiphase abdominal MRI with IV contrast should be considered for further characterization. Electronically Signed   By: Rogelia Myers M.D.   On: 05/15/2024 10:15    ASSESSMENT & PLAN:   Other pancytopenia (HCC) # PANCYTOPENIA-[SEP 2025; leukopenia [Neutrophils:1.2; neutropenia also noted in 2022-with normal platelets and hemoglobin:]hb 11.2; platelets 133. US  sep 2025- Diffuse hepatic steatosis. No morphologic changes of cirrhosis, visualized, at this time;  No splenomegaly. SEP 2025- vitamin deficiencies--including B 12 folic acid; viral infections-HIV/hepatitis- WNL. ;   # Interestingly patient has been asymptomatic from his underlying pancytopenia-no obvious alcohol or liver disease noted-however noted slow progressive over the last 2 to 3 years.  Would recommend further evaluation with a bone marrow biopsy.  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  # Discussed with the patient the bone marrow biopsy and aspiration indication and procedure at length.  Given significant discomfort involved-I would recommend under sedation/with interventional radiology. I discussed the potential  complications include-bleeding/trauma and risk of infection; which are fortunately very rare.  Patient is in agreement. Patient will sign the consent prior to the procedure. Bone marrow biopsy/aspiration is ordered.   #Incidental findings on Imaging US   , 2025:  Nodular echogenicity in the gallbladder fundus, measuring 1.5 x 1.1 x 0.8 cm. This could represent adherent biliary sludge, gallbladder polyp, or focal adenomyomatosis. Nonemergent multiphase abdominal MRI with IV contrast I reviewed/discussed/counseled the  patient. Will plan MRI down the line.   # DISPOSITION: # bone marrow biopsy- in 1-2 weeks-  # follow up  - 3 weeks  after the bone marrow- - MD; No labs- Dr.B   All questions were answered. The patient knows to call the clinic with any problems, questions or concerns.   Cindy JONELLE Joe, MD 05/26/2024 1:55 PM

## 2024-05-26 NOTE — Progress Notes (Signed)
 Fatigue/Weakness:  YES-TIRED A LOT Abnormal bleeding, bruising: NO Bone pain:  NO Headache:  NO Dizziness: NO  U/S abdomen 05/15/24.

## 2024-05-30 ENCOUNTER — Telehealth: Payer: Self-pay | Admitting: *Deleted

## 2024-05-30 NOTE — Telephone Encounter (Signed)
 Bone Marrow Biopsy has been scheduled for Weds 10/15 arrival time is 8:30 am. Confirmed with pt's spouse. Nothing to eat or drink after midnight. He will need a driver. [facility]

## 2024-06-06 ENCOUNTER — Other Ambulatory Visit: Payer: Self-pay | Admitting: Interventional Radiology

## 2024-06-06 DIAGNOSIS — Z01818 Encounter for other preprocedural examination: Secondary | ICD-10-CM

## 2024-06-06 NOTE — Progress Notes (Signed)
 Patient for IR Bone Marrow Biopsy on Wed 06/07/24, I called and lvm for the patient on the phone and gave pre-procedure instructions. VM made patient aware to be here at 8:30a, NPO after MN prior to procedure as well as driver post procedure/recovery/discharge. Called 06/06/24

## 2024-06-07 ENCOUNTER — Ambulatory Visit
Admission: RE | Admit: 2024-06-07 | Discharge: 2024-06-07 | Disposition: A | Discharge status: Home / Self Care | Source: Ambulatory Visit | Attending: Internal Medicine | Admitting: Internal Medicine

## 2024-06-07 DIAGNOSIS — Z1379 Encounter for other screening for genetic and chromosomal anomalies: Secondary | ICD-10-CM | POA: Insufficient documentation

## 2024-06-07 DIAGNOSIS — D61818 Other pancytopenia: Secondary | ICD-10-CM | POA: Insufficient documentation

## 2024-06-07 DIAGNOSIS — Z01818 Encounter for other preprocedural examination: Secondary | ICD-10-CM

## 2024-06-07 LAB — CBC WITH DIFFERENTIAL/PLATELET
Abs Immature Granulocytes: 0.01 K/uL (ref 0.00–0.07)
Basophils Absolute: 0 K/uL (ref 0.0–0.1)
Basophils Relative: 1 %
Eosinophils Absolute: 0 K/uL (ref 0.0–0.5)
Eosinophils Relative: 2 %
HCT: 33 % — ABNORMAL LOW (ref 39.0–52.0)
Hemoglobin: 11.1 g/dL — ABNORMAL LOW (ref 13.0–17.0)
Immature Granulocytes: 1 %
Lymphocytes Relative: 44 %
Lymphs Abs: 0.8 K/uL (ref 0.7–4.0)
MCH: 33.9 pg (ref 26.0–34.0)
MCHC: 33.6 g/dL (ref 30.0–36.0)
MCV: 100.9 fL — ABNORMAL HIGH (ref 80.0–100.0)
Monocytes Absolute: 0.2 K/uL (ref 0.1–1.0)
Monocytes Relative: 9 %
Neutro Abs: 0.7 K/uL — ABNORMAL LOW (ref 1.7–7.7)
Neutrophils Relative %: 43 %
Platelets: 128 K/uL — ABNORMAL LOW (ref 150–400)
RBC: 3.27 MIL/uL — ABNORMAL LOW (ref 4.22–5.81)
RDW: 14.1 % (ref 11.5–15.5)
Smear Review: NORMAL
WBC: 1.7 K/uL — ABNORMAL LOW (ref 4.0–10.5)
nRBC: 0 % (ref 0.0–0.2)

## 2024-06-07 MED ORDER — SODIUM CHLORIDE 0.9 % IV SOLN: INTRAVENOUS | Status: DC

## 2024-06-07 MED ORDER — HEPARIN SOD (PORK) LOCK FLUSH 100 UNIT/ML IV SOLN
INTRAVENOUS | Status: AC
  Filled 2024-06-07: qty 5

## 2024-06-07 MED ORDER — MIDAZOLAM HCL 2 MG/2ML IJ SOLN
INTRAMUSCULAR | Status: AC | PRN
  Administered 2024-06-07: 1 mg via INTRAVENOUS
  Administered 2024-06-07: .5 mg via INTRAVENOUS

## 2024-06-07 MED ORDER — LIDOCAINE 1 % OPTIME INJ - NO CHARGE
5.0000 mL | Freq: Once | INTRAMUSCULAR | Status: AC
  Administered 2024-06-07: 5 mL via INTRADERMAL
  Filled 2024-06-07: qty 6

## 2024-06-07 MED ORDER — MIDAZOLAM HCL 2 MG/2ML IJ SOLN
INTRAMUSCULAR | Status: AC
  Filled 2024-06-07: qty 2

## 2024-06-07 MED ORDER — FENTANYL CITRATE (PF) 100 MCG/2ML IJ SOLN
INTRAMUSCULAR | Status: AC
  Filled 2024-06-07: qty 2

## 2024-06-07 MED ORDER — FENTANYL CITRATE (PF) 100 MCG/2ML IJ SOLN
INTRAMUSCULAR | Status: AC | PRN
  Administered 2024-06-07: 25 ug via INTRAVENOUS
  Administered 2024-06-07: 50 ug via INTRAVENOUS

## 2024-06-07 NOTE — H&P (Signed)
 Chief Complaint:  Pancytopenia  Procedure: Bone Marrow Biopsy with Aspiration  Referring Provider(s): Dr. KANDICE Joe  Supervising Physician: Philip Cornet  Patient Status: [facility] - Out-pt  History of Present Illness: Eric Stanton is a 73 y.o. male with a history of pancytopenia noted for the past several years. He was referred to Heme/Onc for further workup given the unclear etiology. Initial lab work including hepatitis panel, iron studies, B12, and folate all completed at his initial workup and unremarkable. He was noted to have elevated kappa free light chains and lambda free light chains on 9/19. Patient referred to IR for bone marrow biopsy with aspiration.   He presents today for his procedure. Continues to report no symptoms at this time. Denies any worsening shortness of breath, fevers/chills, chest pain, abdominal pain. NPO since midnight. All questions and concerns answered at the bedside.    Patient is Full Code  Past Medical History:  Diagnosis Date   Colon polyps    COPD (chronic obstructive pulmonary disease) (HCC)    Diverticulosis    GERD (gastroesophageal reflux disease)    Macular degeneration of right eye     Past Surgical History:  Procedure Laterality Date   HERNIA REPAIR  2010    Allergies: Codeine and Oxycodone-acetaminophen  Medications: Prior to Admission medications   Medication Sig Start Date End Date Taking? Authorizing Provider  albuterol  (PROVENTIL ) (2.5 MG/3ML) 0.083% nebulizer solution Take 3 mLs (2.5 mg total) by nebulization every 6 (six) hours as needed for wheezing or shortness of breath. 08/25/21  Yes Merilee Andrea LITTIE, NP  Ascorbic Acid (VITAMIN C) 1000 MG tablet Take 1,000 mg by mouth daily.   Yes [provider]  aspirin EC 81 MG tablet Take by mouth.   Yes [provider]  atorvastatin  (LIPITOR) 10 MG tablet TAKE 1 TABLET BY MOUTH EVERY DAY 02/21/21  Yes Justus Leita DEL, MD  docusate sodium (COLACE) 100 MG capsule  Take by mouth.   Yes [provider]  famotidine (PEPCID) 20 MG tablet Take 20 mg by mouth daily.    Yes [provider]  fluticasone -salmeterol (ADVAIR HFA) 230-21 MCG/ACT inhaler Inhale 2 puffs into the lungs 2 (two) times daily. 03/07/20  Yes Justus Leita DEL, MD  Ipratropium-Albuterol  (COMBIVENT  RESPIMAT) 20-100 MCG/ACT AERS respimat TAKE 1 PUFF INTO THE LUNGS FOUR TIMES A DAY AS NEEDED FOR WHEEZING 05/30/21  Yes Justus Leita DEL, MD  Multiple Vitamins-Minerals (PRESERVISION AREDS 2+MULTI VIT PO) Take by mouth.   Yes [provider]  Saw Palmetto 1000 MG CAPS Take by mouth.   Yes [provider]  vitamin B-12 (CYANOCOBALAMIN) 1000 MCG tablet Take 1,000 mcg by mouth daily.   Yes [provider]  vitamin D3 (CHOLECALCIFEROL) 25 MCG tablet Take 1,000 Units by mouth daily.   Yes [provider]     Family History  Problem Relation Age of Onset   Liver cancer Mother    Encopresis Father    COPD Father    Cancer Maternal Aunt    Cancer Maternal Uncle     Social History   Socioeconomic History   Marital status: Married    Spouse name: Not on file   Number of children: 5   Years of education: Not on file   Highest education level: 10th grade  Occupational History   Occupation: retired  Tobacco Use   Smoking status: Former    Current packs/day: 0.00    Average packs/day: 2.0 packs/day for 20.0 years (  40.0 ttl pk-yrs)    Types: Cigarettes    Start date: 35    Quit date: 60    Years since quitting: 40.8   Smokeless tobacco: Never   Tobacco comments:    Smoking cessation materials not required  Vaping Use   Vaping status: Never Used  Substance and Sexual Activity   Alcohol use: Not Currently   Drug use: Never   Sexual activity: Yes  Other Topics Concern   Not on file  Social History Narrative   Not on file   Social Drivers of Health   Financial Resource Strain: Low Risk  (05/12/2024)   Overall Financial Resource  Strain (CARDIA)    Difficulty of Paying Living Expenses: Not very hard  Food Insecurity: No Food Insecurity (05/12/2024)   Hunger Vital Sign    Worried About Running Out of Food in the Last Year: Never true    Ran Out of Food in the Last Year: Never true  Transportation Needs: No Transportation Needs (05/12/2024)   PRAPARE - Administrator, Civil Service (Medical): No    Lack of Transportation (Non-Medical): No  Physical Activity: Inactive (05/12/2024)   Exercise Vital Sign    Days of Exercise per Week: 0 days    Minutes of Exercise per Session: 0 min  Stress: No Stress Concern Present (05/12/2024)   [employer] of Occupational Health - Occupational Stress Questionnaire    Feeling of Stress: Not at all  Social Connections: Moderately Integrated (05/12/2024)   Social Connection and Isolation Panel    Frequency of Communication with Friends and Family: More than three times a week    Frequency of Social Gatherings with Friends and Family: Three times a week    Attends Religious Services: More than 4 times per year    Active Member of Clubs or Organizations: No    Attends Banker Meetings: Never    Marital Status: Married     Review of Systems Patient denies any headache, chest pain, shortness of breath, abdominal pain, N/V, or fever/chills. All other systems are negative.   Vital Signs: BP 120/70   Pulse 75   Temp 97.7 F (36.5 C)   Resp 18   Ht 5' 11 (1.803 m)   Wt 150 lb (68 kg)   SpO2 99%   BMI 20.92 kg/m     Physical Exam Vitals reviewed.  Constitutional:      Appearance: Normal appearance.  HENT:     Head: Normocephalic and atraumatic.     Mouth/Throat:     Mouth: Mucous membranes are moist.     Pharynx: Oropharynx is clear.  Cardiovascular:     Rate and Rhythm: Normal rate and regular rhythm.  Pulmonary:     Effort: Pulmonary effort is normal.  Abdominal:     General: Abdomen is flat.     Palpations: Abdomen is soft.      Tenderness: There is no abdominal tenderness.  Musculoskeletal:        General: Normal range of motion.     Cervical back: Normal range of motion.  Skin:    General: Skin is warm.  Neurological:     General: No focal deficit present.     Mental Status: He is alert and oriented to person, place, and time. Mental status is at baseline.  Psychiatric:        Mood and Affect: Mood normal.        Behavior: Behavior normal.  Judgment: Judgment normal.     Imaging: US  Abdomen Complete Result Date: 05/15/2024 CLINICAL DATA:  cirrhosis/splenomegaly EXAM: ABDOMEN ULTRASOUND COMPLETE COMPARISON:  None Available. FINDINGS: Gallbladder: No gallstones. No pericholecystic fluid. Nodular echogenicity along the gallbladder fundus, measuring 1.5 x 1.1 x 0.8 cm. No sonographic Murphy's sign noted by sonographer. Common bile duct: Diameter: 3 mm Liver: Increased echogenicity. No focal lesion identified. No intrahepatic biliary ductal dilation. Portal vein is patent on color Doppler imaging with normal direction of blood flow towards the liver. IVC: No abnormality visualized. Pancreas: Visualized portion unremarkable. Spleen: Size and appearance within normal limits. Right Kidney: Length: 10.5 cm. Normal echogenicity. No mass. No hydronephrosis or nephrolithiasis. Left Kidney: Length: 10.3 cm. Normal echogenicity. No mass. No hydronephrosis or nephrolithiasis. Abdominal aorta: No aneurysm visualized. Other findings: None. IMPRESSION: 1. Diffuse hepatic steatosis. No morphologic changes of cirrhosis, visualized, at this time. 2. No splenomegaly. 3. Nodular echogenicity in the gallbladder fundus, measuring 1.5 x 1.1 x 0.8 cm. This could represent adherent biliary sludge, gallbladder polyp, or focal adenomyomatosis. Nonemergent multiphase abdominal MRI with IV contrast should be considered for further characterization. Electronically Signed   By: Rogelia Myers M.D.   On: 05/15/2024 10:15    Labs:  CBC: Recent  Labs    05/12/24 1211  WBC 2.4*  HGB 11.3*  HCT 34.0*  PLT 149*    COAGS: No results for input(s): INR, APTT in the last 8760 hours.  BMP: Recent Labs    05/12/24 1211  NA 136  K 4.3  CL 104  CO2 25  GLUCOSE 101*  BUN 12  CALCIUM  8.6*  CREATININE 0.96  GFRNONAA >60    LIVER FUNCTION TESTS: Recent Labs    05/12/24 1211  BILITOT 0.8  AST 12*  ALT 7  ALKPHOS 72  PROT 6.9  ALBUMIN 4.3    TUMOR MARKERS: No results for input(s): AFPTM, CEA, CA199, CHROMGRNA in the last 8760 hours.  Assessment and Plan:  Pancytopenia: Eric Stanton is a 73 y.o. 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 DELENA Balder. Procedure to be performed under moderate sedation.  Risks and benefits of bone marrow biopsy was discussed with the patient and/or patient's family including, but not limited to bleeding, infection, damage to adjacent structures or low yield requiring additional tests.  All of the questions were answered and there is agreement to proceed.  Consent signed and in chart.   Thank you for allowing our service to participate in Eric Stanton 's care.    Electronically Signed: Glennon CHRISTELLA Bal, PA-C   06/07/2024, 8:56 AM     I spent a total of  30 Minutes   in face to face in clinical consultation, greater than 50% of which was counseling/coordinating care for bone marrow biopsy.

## 2024-06-07 NOTE — Progress Notes (Signed)
 Patient has remained clinically stable post IR BMB per Dr Philip, tolerated well. Vitals have remained stable as from pre procedure. Wife present with discharge instructions given. Taking po's without difficulty.

## 2024-06-07 NOTE — Procedures (Signed)
 Interventional Radiology Procedure:   Indications: Pancytopenia  Procedure: Image guided bone marrow biopsy  Findings: 2 aspirates and 1 core from right ilium  Complications: None     EBL: Minimal, less than 10 ml  Plan: Discharge to home in one hour.   Tonnette Zwiebel R. Julietta Ogren, MD  Pager: [phone]

## 2024-06-13 LAB — SURGICAL PATHOLOGY

## 2024-06-15 ENCOUNTER — Encounter (HOSPITAL_COMMUNITY): Payer: Self-pay | Admitting: Internal Medicine

## 2024-06-21 ENCOUNTER — Encounter (HOSPITAL_COMMUNITY): Payer: Self-pay | Admitting: Student in an Organized Health Care Education/Training Program

## 2024-06-22 ENCOUNTER — Encounter (HOSPITAL_COMMUNITY): Payer: Self-pay

## 2024-06-25 ENCOUNTER — Ambulatory Visit: Payer: Self-pay | Admitting: Internal Medicine

## 2024-06-25 NOTE — Telephone Encounter (Signed)
 Please have the patient follow-up with me this week-to review the results of the bone marrow biopsy; MD no labs.  Also-Michelle please check with pathology if NGS panel has been ordered.  If not ordered, please order ASAP. Thanks GB

## 2024-06-27 ENCOUNTER — Inpatient Hospital Stay: Attending: Internal Medicine | Admitting: Internal Medicine

## 2024-06-27 ENCOUNTER — Encounter: Payer: Self-pay | Admitting: Internal Medicine

## 2024-06-27 VITALS — BP 126/84 | HR 90 | Temp 98.1°F | Resp 16 | Ht 71.0 in | Wt 150.7 lb

## 2024-06-27 DIAGNOSIS — D469 Myelodysplastic syndrome, unspecified: Secondary | ICD-10-CM | POA: Insufficient documentation

## 2024-06-27 NOTE — Assessment & Plan Note (Addendum)
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 OCT 2025- - Hypercellular bone marrow (80%) with morphologic dyspoiesis; Morphologic evaluation of the bone marrow reveals a hypercellular bone  marrow with a myeloid left shift.  The erythroid lineage shows  vascularization and nuclear membrane irregularities.  The megakaryocyte  lineage shows dysplastic forms including hypolobated forms.  While the  overall blast percentage is approximately 5%, focal areas of clustering  are identified on CD34 immunohistochemical analysis.  Cytogenetics -karyotype - no metaphase cells noted. NGS:  SFSF2; TET2.   # October 2025 -platelets greater than 100; ANC 0.7; hemoglobin 11.  Patient asymptomatic.  R-IPSS: Unable to calculate because of lack of karyotype.;  However-likely low risk.  Discussed average life expectancy is anywhere between 3 to 5 years.# Discussed treatment options include hypomethylating agents; metronomic dosing- hypomethylating agents plus venetoclax.  Also consider growth factor support if frequent infections noted.  # Discussed MDS cannot be cured in general.  Only curative option would be allogenic stem cell transplant.  Patient is not interested.  Discussed regarding a second opinion with hematology specialty at [facility].  Currently declines he will let us  know at the next visit.  # Continue surveillance for now-as patient is asymptomatic.  Will follow-up again in 3 months.  #Incidental findings on Imaging US   , 2025:  Nodular echogenicity in the gallbladder fundus, measuring 1.5 x 1.1 x 0.8 cm. This could represent adherent biliary sludge, gallbladder polyp, or focal adenomyomatosis.  Discussed regarding MRI-patient declines.  # Vaccination: recommend flu shot. [Status post pneumonia vaccination; shingles RSV]  # DISPOSITION: # follow up in 3 months- MD; labs- cbc/cmp; LDH- Epo levels- Dr.B

## 2024-06-27 NOTE — Progress Notes (Signed)
 BMBx 06/15/24.

## 2024-06-27 NOTE — Progress Notes (Signed)
 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 OCT 2025- - Hypercellular bone marrow (80%) with morphologic dyspoiesis; Morphologic evaluation of the bone marrow reveals a hypercellular bone  marrow with a myeloid left shift.  The erythroid lineage shows  vascularization and nuclear membrane irregularities.  The megakaryocyte  lineage shows dysplastic forms including hypolobated forms.  While the  overall blast percentage is approximately 5%, focal areas of clustering  are identified on CD34 immunohistochemical analysis.  Cytogenetics -karyotype - no metaphase cells noted. NGS:  SFSF2; TET2. LOW RISK MDS-    MDS (myelodysplastic syndrome) (HCC)  06/27/2024 Initial Diagnosis   MDS (myelodysplastic syndrome) (HCC)      HISTORY OF PRESENTING ILLNESS: Patient ambulating-independently.Accompanied by  daughter, Eric Stanton.  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 He was referred by Dr. Adamo for evaluation of low blood counts.  He has experienced low blood counts for the past four to five years, with a noted decrease in hemoglobin and platelet levels. His current hemoglobin is 11, and platelets are 128. He recently underwent a bone marrow biopsy, which he tolerated well without pain, bleeding, fever, or chills post-procedure. He has not required blood transfusions.  He has a chronic cough with phlegm production due to chronic obstructive pulmonary disease (COPD). No recent infections such as bronchitis, sinus infections, bladder infections, or  skin infections.  An ultrasound revealed a small growth on his gallbladder, causing soreness and a sensation of something rubbing against his ribcage. He has not pursued further imaging such as an MRI to investigate this finding.  He is up to date on vaccinations, including flu, pneumonia, shingles, tetanus, and RSV vaccines.       Review of Systems  Constitutional:  Negative for chills, diaphoresis, fever, malaise/fatigue and weight loss.  HENT:  Negative for nosebleeds and sore throat.   Eyes:  Negative for double vision.  Respiratory:  Negative for cough, hemoptysis, sputum production, shortness of breath and wheezing.   Cardiovascular:  Negative for chest pain, palpitations, orthopnea and leg swelling.  Gastrointestinal:  Negative for abdominal pain, blood in stool, constipation, diarrhea, heartburn, melena, nausea and vomiting.  Genitourinary:  Negative for dysuria, frequency and urgency.  Musculoskeletal:  Positive for joint pain. Negative for back pain.  Skin: Negative.  Negative for itching and rash.  Neurological:  Negative for dizziness, tingling, focal weakness, weakness and headaches.  Endo/Heme/Allergies:  Does not bruise/bleed easily.  Psychiatric/Behavioral:  Negative for depression. The patient is not nervous/anxious and does not have insomnia.      MEDICAL HISTORY:  Past Medical History:  Diagnosis Date   Colon polyps    COPD (chronic obstructive pulmonary disease) (HCC)    Diverticulosis    GERD (gastroesophageal reflux disease)    Macular degeneration of right eye     SURGICAL HISTORY: Past Surgical History:  Procedure Laterality Date   HERNIA REPAIR  2010   IR BONE MARROW BIOPSY & ASPIRATION  06/07/2024    SOCIAL HISTORY: Social History   Socioeconomic History   Marital status: Married    Spouse name: Not on file  Number of children: 5   Years of education: Not on file   Highest education level: 10th grade  Occupational History   Occupation:  retired  Tobacco Use   Smoking status: Former    Current packs/day: 0.00    Average packs/day: 2.0 packs/day for 20.0 years (40.0 ttl pk-yrs)    Types: Cigarettes    Start date: 50    Quit date: 74    Years since quitting: 40.8   Smokeless tobacco: Never   Tobacco comments:    Smoking cessation materials not required  Vaping Use   Vaping status: Never Used  Substance and Sexual Activity   Alcohol use: Not Currently   Drug use: Never   Sexual activity: Yes  Other Topics Concern   Not on file  Social History Narrative   Not on file   Social Drivers of Health   Financial Resource Strain: Low Risk  (05/12/2024)   Overall Financial Resource Strain (CARDIA)    Difficulty of Paying Living Expenses: Not very hard  Food Insecurity: No Food Insecurity (05/12/2024)   Hunger Vital Sign    Worried About Running Out of Food in the Last Year: Never true    Ran Out of Food in the Last Year: Never true  Transportation Needs: No Transportation Needs (05/12/2024)   PRAPARE - Administrator, Civil Service (Medical): No    Lack of Transportation (Non-Medical): No  Physical Activity: Inactive (05/12/2024)   Exercise Vital Sign    Days of Exercise per Week: 0 days    Minutes of Exercise per Session: 0 min  Stress: No Stress Concern Present (05/12/2024)   [employer] of Occupational Health - Occupational Stress Questionnaire    Feeling of Stress: Not at all  Social Connections: Moderately Integrated (05/12/2024)   Social Connection and Isolation Panel    Frequency of Communication with Friends and Family: More than three times a week    Frequency of Social Gatherings with Friends and Family: Three times a week    Attends Religious Services: More than 4 times per year    Active Member of Clubs or Organizations: No    Attends Banker Meetings: Never    Marital Status: Married  Catering Manager Violence: Not At Risk (05/12/2024)   Humiliation, Afraid, Rape, and  Kick questionnaire    Fear of Current or Ex-Partner: No    Emotionally Abused: No    Physically Abused: No    Sexually Abused: No    FAMILY HISTORY: Family History  Problem Relation Age of Onset   Liver cancer Mother    Encopresis Father    COPD Father    Cancer Maternal Aunt    Cancer Maternal Uncle     ALLERGIES:  is allergic to codeine and oxycodone-acetaminophen.  MEDICATIONS:  Current Outpatient Medications  Medication Sig Dispense Refill   albuterol  (PROVENTIL ) (2.5 MG/3ML) 0.083% nebulizer solution Take 3 mLs (2.5 mg total) by nebulization every 6 (six) hours as needed for wheezing or shortness of breath. 75 mL 12   Ascorbic Acid (VITAMIN C) 1000 MG tablet Take 1,000 mg by mouth daily.     aspirin EC 81 MG tablet Take by mouth.     atorvastatin  (LIPITOR) 10 MG tablet TAKE 1 TABLET BY MOUTH EVERY DAY 90 tablet 2   docusate sodium (COLACE) 100 MG capsule Take by mouth.     famotidine (PEPCID) 20 MG tablet Take 20 mg by mouth daily.      fluticasone -salmeterol (ADVAIR  HFA) 230-21 MCG/ACT inhaler Inhale 2 puffs into the lungs 2 (two) times daily. 1 Inhaler 12   Ipratropium-Albuterol  (COMBIVENT  RESPIMAT) 20-100 MCG/ACT AERS respimat TAKE 1 PUFF INTO THE LUNGS FOUR TIMES A DAY AS NEEDED FOR WHEEZING 12 g 12   Multiple Vitamins-Minerals (PRESERVISION AREDS 2+MULTI VIT PO) Take by mouth.     Saw Palmetto 1000 MG CAPS Take by mouth.     vitamin B-12 (CYANOCOBALAMIN) 1000 MCG tablet Take 1,000 mcg by mouth daily.     vitamin D3 (CHOLECALCIFEROL) 25 MCG tablet Take 1,000 Units by mouth daily.     No current facility-administered medications for this visit.     PHYSICAL EXAMINATION:   Vitals:   06/27/24 1318  BP: 126/84  Pulse: 90  Resp: 16  Temp: 98.1 F (36.7 C)  SpO2: 98%   Filed Weights   06/27/24 1318  Weight: 150 lb 11.2 oz (68.4 kg)    Splenomegaly   Physical Exam Vitals and nursing note reviewed.  HENT:     Head: Normocephalic and atraumatic.      Mouth/Throat:     Pharynx: Oropharynx is clear.  Eyes:     Extraocular Movements: Extraocular movements intact.     Pupils: Pupils are equal, round, and reactive to light.  Cardiovascular:     Rate and Rhythm: Normal rate and regular rhythm.  Pulmonary:     Comments: Decreased breath sounds bilaterally.  Abdominal:     Palpations: Abdomen is soft.  Musculoskeletal:        General: Normal range of motion.     Cervical back: Normal range of motion.  Skin:    General: Skin is warm.  Neurological:     General: No focal deficit present.     Mental Status: He is alert and oriented to person, place, and time.  Psychiatric:        Behavior: Behavior normal.        Judgment: Judgment normal.      LABORATORY DATA:  I have reviewed the data as listed Lab Results  Component Value Date   WBC 1.7 (L) 06/07/2024   HGB 11.1 (L) 06/07/2024   HCT 33.0 (L) 06/07/2024   MCV 100.9 (H) 06/07/2024   PLT 128 (L) 06/07/2024   Recent Labs    05/12/24 1211  NA 136  K 4.3  CL 104  CO2 25  GLUCOSE 101*  BUN 12  CREATININE 0.96  CALCIUM  8.6*  GFRNONAA >60  PROT 6.9  ALBUMIN 4.3  AST 12*  ALT 7  ALKPHOS 72  BILITOT 0.8     IR BONE MARROW BIOPSY & ASPIRATION Result Date: 06/07/2024 INDICATION: 73 year old with pancytopenia. EXAM: FLUOROSCOPIC GUIDED BONE MARROW ASPIRATES AND BIOPSY Physician: Juliene SAUNDERS. Henn, MD MEDICATIONS: Moderate sedation ANESTHESIA/SEDATION: Moderate (conscious) sedation was employed during this procedure. A total of Versed 1.5 mg and fentanyl 75 mcg was administered intravenously at the order of the provider performing the procedure. Total intra-service moderate sedation time:  . Patient's level of consciousness and vital signs were monitored continuously by radiology nurse throughout the procedure under the supervision of the provider performing the procedure. FLUOROSCOPY: Radiation Exposure Index (as provided by the fluoroscopic device): 11 mGy Kerma  COMPLICATIONS: None immediate. PROCEDURE: The procedure was explained to the patient. The risks and benefits of the procedure were discussed and the patient's questions were addressed. Informed consent was obtained from the patient. The patient was placed prone on interventional table. The back was prepped and draped in sterile fashion.  Maximal barrier sterile technique was utilized including caps, mask, sterile gowns, sterile gloves, sterile drape, hand hygiene and skin antiseptic. The skin and right posterior ilium were anesthetized with 1% lidocaine. 11 gauge bone needle was directed into the right ilium with fluoroscopic guidance. Two aspirates and 1 core biopsy were obtained. Bandage placed over the puncture site. Fluoroscopic image saved for documentation. IMPRESSION: Fluoroscopic guided bone marrow aspiration and core biopsy. Electronically Signed   By: Juliene Balder M.D.   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 OCT 2025- - Hypercellular bone marrow (80%) with morphologic dyspoiesis; Morphologic evaluation of the bone marrow reveals a hypercellular bone  marrow with a myeloid left shift.  The erythroid lineage shows  vascularization and nuclear membrane irregularities.  The megakaryocyte  lineage shows dysplastic forms including hypolobated forms.  While the  overall blast percentage is approximately 5%, focal areas of clustering  are identified on CD34 immunohistochemical analysis.  Cytogenetics -karyotype - no metaphase cells noted. NGS:  SFSF2; TET2.   # October 2025 -platelets greater than 100; ANC 0.7; hemoglobin 11.  Patient asymptomatic.  R-IPSS: Unable to calculate because of lack of karyotype.;  However-likely low risk.  Discussed average life expectancy is anywhere between 3 to 5 years.# Discussed treatment options include hypomethylating agents; metronomic dosing-  hypomethylating agents plus venetoclax.  Also consider growth factor support if frequent infections noted.  # Discussed MDS cannot be cured in general.  Only curative option would be allogenic stem cell transplant.  Patient is not interested.  Discussed regarding a second opinion with hematology specialty at [facility].  Currently declines he will let us  know at the next visit.  # Continue surveillance for now-as patient is asymptomatic.  Will follow-up again in 3 months.  #Incidental findings on Imaging US   , 2025:  Nodular echogenicity in the gallbladder fundus, measuring 1.5 x 1.1 x 0.8 cm. This could represent adherent biliary sludge, gallbladder polyp, or focal adenomyomatosis.  Discussed regarding MRI-patient declines.  # Vaccination: recommend flu shot. [Status post pneumonia vaccination; shingles RSV]  # DISPOSITION: # follow up in 3 months- MD; labs- cbc/cmp; LDH- Epo levels- Dr.B  All questions were answered. The patient knows to call the clinic with any problems, questions or concerns.   Cindy JONELLE Joe, MD 06/27/2024 2:11 PM

## 2024-09-27 ENCOUNTER — Encounter: Payer: Self-pay | Admitting: Internal Medicine

## 2024-09-27 ENCOUNTER — Inpatient Hospital Stay: Payer: Self-pay | Admitting: Internal Medicine

## 2024-09-27 ENCOUNTER — Inpatient Hospital Stay: Payer: Self-pay | Attending: Internal Medicine

## 2024-09-27 VITALS — BP 117/79 | HR 88 | Temp 98.1°F | Resp 16 | Ht 71.0 in | Wt 154.0 lb

## 2024-09-27 DIAGNOSIS — D469 Myelodysplastic syndrome, unspecified: Secondary | ICD-10-CM

## 2024-09-27 LAB — CBC WITH DIFFERENTIAL (CANCER CENTER ONLY)
Abs Immature Granulocytes: 0.01 10*3/uL (ref 0.00–0.07)
Basophils Absolute: 0 10*3/uL (ref 0.0–0.1)
Basophils Relative: 1 %
Eosinophils Absolute: 0 10*3/uL (ref 0.0–0.5)
Eosinophils Relative: 1 %
HCT: 32.8 % — ABNORMAL LOW (ref 39.0–52.0)
Hemoglobin: 11 g/dL — ABNORMAL LOW (ref 13.0–17.0)
Immature Granulocytes: 1 %
Lymphocytes Relative: 42 %
Lymphs Abs: 0.8 10*3/uL (ref 0.7–4.0)
MCH: 34 pg (ref 26.0–34.0)
MCHC: 33.5 g/dL (ref 30.0–36.0)
MCV: 101.2 fL — ABNORMAL HIGH (ref 80.0–100.0)
Monocytes Absolute: 0.2 10*3/uL (ref 0.1–1.0)
Monocytes Relative: 12 %
Neutro Abs: 0.8 10*3/uL — ABNORMAL LOW (ref 1.7–7.7)
Neutrophils Relative %: 43 %
Platelet Count: 112 10*3/uL — ABNORMAL LOW (ref 150–400)
RBC: 3.24 MIL/uL — ABNORMAL LOW (ref 4.22–5.81)
RDW: 13.4 % (ref 11.5–15.5)
WBC Count: 1.9 10*3/uL — ABNORMAL LOW (ref 4.0–10.5)
nRBC: 0 % (ref 0.0–0.2)

## 2024-09-27 LAB — CMP (CANCER CENTER ONLY)
ALT: <5 U/L (ref 0–44)
AST: 11 U/L — ABNORMAL LOW (ref 15–41)
Albumin: 4.3 g/dL (ref 3.5–5.0)
Alkaline Phosphatase: 79 U/L (ref 38–126)
Anion gap: 10 (ref 5–15)
BUN: 11 mg/dL (ref 8–23)
CO2: 26 mmol/L (ref 22–32)
Calcium: 9 mg/dL (ref 8.9–10.3)
Chloride: 102 mmol/L (ref 98–111)
Creatinine: 1.04 mg/dL (ref 0.61–1.24)
GFR, Estimated: >60 mL/min
Glucose, Bld: 99 mg/dL (ref 70–99)
Potassium: 4.3 mmol/L (ref 3.5–5.1)
Sodium: 138 mmol/L (ref 135–145)
Total Bilirubin: 0.6 mg/dL (ref 0.0–1.2)
Total Protein: 6.7 g/dL (ref 6.5–8.1)

## 2024-09-27 LAB — LACTATE DEHYDROGENASE: LDH: 102 U/L — ABNORMAL LOW (ref 105–235)

## 2024-09-27 NOTE — Progress Notes (Signed)
 [facility] CONSULT NOTE  Patient Care Team: Adina Buel HERO, MD as PCP - General (Family Medicine) Myrna Adine Anes, MD as Consulting Physician (Ophthalmology) Rennie Cindy SAUNDERS, MD as Consulting Physician (Oncology)  CHIEF COMPLAINTS/PURPOSE OF CONSULTATION: Pancytopenia  Oncology History Overview Note  # Asymptomatic from his underlying pancytopenia  however slow progressive since 2021. OCT 2025- - Hypercellular bone marrow (80%) with morphologic dyspoiesis; Morphologic evaluation of the bone marrow reveals a hypercellular bone  marrow with a myeloid left shift.  The erythroid lineage shows  vascularization and nuclear membrane irregularities.  The megakaryocyte  lineage shows dysplastic forms including hypolobated forms.  While the  overall blast percentage is approximately 5%, focal areas of clustering  are identified on CD34 immunohistochemical analysis.  Cytogenetics -karyotype - no metaphase cells noted. NGS:  SFSF2; TET2. LOW RISK MDS-    MDS (myelodysplastic syndrome) (HCC)  06/27/2024 Initial Diagnosis   MDS (myelodysplastic syndrome) (HCC)    HISTORY OF PRESENTING ILLNESS: Patient ambulating-independently. Accompanied by wife, nancy.   History of Present Illness   Eric Stanton is a 74 year old male with myelodysplastic syndrome presenting for routine hematology/oncology follow-up.  He remains under active surveillance for myelodysplastic syndrome with persistent neutropenia, anemia, and thrombocytopenia. He reports feeling generally well. He denies gastrointestinal bleeding, including hematochezia and melena. Laboratory values show stable cytopenias: platelets have decreased slightly from 120 to 112, hemoglobin remains at 11.0, and white blood cell count fluctuates between 1.7 and 1.9. Cytopenias have been present since 2022, without acute changes or severe cytopenias.  He recently experienced an acute respiratory infection, initially described as a cold  progressing to bronchitis. He completed a course of doxycycline and reports complete resolution of symptoms, with no ongoing cough or respiratory distress. He did not have chest imaging and did not develop pneumonia. He received his annual influenza vaccination.       Review of Systems  Constitutional:  Negative for chills, diaphoresis, fever, malaise/fatigue and weight loss.  HENT:  Negative for nosebleeds and sore throat.   Eyes:  Negative for double vision.  Respiratory:  Negative for cough, hemoptysis, sputum production, shortness of breath and wheezing.   Cardiovascular:  Negative for chest pain, palpitations, orthopnea and leg swelling.  Gastrointestinal:  Negative for abdominal pain, blood in stool, constipation, diarrhea, heartburn, melena, nausea and vomiting.  Genitourinary:  Negative for dysuria, frequency and urgency.  Musculoskeletal:  Positive for joint pain. Negative for back pain.  Skin: Negative.  Negative for itching and rash.  Neurological:  Negative for dizziness, tingling, focal weakness, weakness and headaches.  Endo/Heme/Allergies:  Does not bruise/bleed easily.  Psychiatric/Behavioral:  Negative for depression. The patient is not nervous/anxious and does not have insomnia.      MEDICAL HISTORY:  Past Medical History:  Diagnosis Date   Colon polyps    COPD (chronic obstructive pulmonary disease) (HCC)    Diverticulosis    GERD (gastroesophageal reflux disease)    Macular degeneration of right eye     SURGICAL HISTORY: Past Surgical History:  Procedure Laterality Date   HERNIA REPAIR  2010   IR BONE MARROW BIOPSY & ASPIRATION  06/07/2024    SOCIAL HISTORY: Social History   Socioeconomic History   Marital status: Married    Spouse name: Not on file   Number of children: 5   Years of education: Not on file   Highest education level: 10th grade  Occupational History   Occupation: retired  Tobacco Use   Smoking status: Former  Current packs/day:  0.00    Average packs/day: 2.0 packs/day for 20.0 years (40.0 ttl pk-yrs)    Types: Cigarettes    Start date: 64    Quit date: 61    Years since quitting: 41.1   Smokeless tobacco: Never   Tobacco comments:    Smoking cessation materials not required  Vaping Use   Vaping status: Never Used  Substance and Sexual Activity   Alcohol use: Not Currently   Drug use: Never   Sexual activity: Yes  Other Topics Concern   Not on file  Social History Narrative   Not on file   Social Drivers of Health   Tobacco Use: Medium Risk (09/27/2024)   Patient History    Smoking Tobacco Use: Former    Smokeless Tobacco Use: Never    Passive Exposure: Not on file  Financial Resource Strain: Low Risk (05/12/2024)   Overall Financial Resource Strain (CARDIA)    Difficulty of Paying Living Expenses: Not very hard  Food Insecurity: No Food Insecurity (05/12/2024)   Epic    Worried About Programme Researcher, Broadcasting/film/video in the Last Year: Never true    Ran Out of Food in the Last Year: Never true  Transportation Needs: No Transportation Needs (05/12/2024)   Epic    Lack of Transportation (Medical): No    Lack of Transportation (Non-Medical): No  Physical Activity: Inactive (05/12/2024)   Exercise Vital Sign    Days of Exercise per Week: 0 days    Minutes of Exercise per Session: 0 min  Stress: No Stress Concern Present (05/12/2024)   [employer] of Occupational Health - Occupational Stress Questionnaire    Feeling of Stress: Not at all  Social Connections: Moderately Integrated (05/12/2024)   Social Connection and Isolation Panel    Frequency of Communication with Friends and Family: More than three times a week    Frequency of Social Gatherings with Friends and Family: Three times a week    Attends Religious Services: More than 4 times per year    Active Member of Clubs or Organizations: No    Attends Banker Meetings: Never    Marital Status: Married  Catering Manager Violence: Not At  Risk (05/12/2024)   Epic    Fear of Current or Ex-Partner: No    Emotionally Abused: No    Physically Abused: No    Sexually Abused: No  Depression (PHQ2-9): Low Risk (09/27/2024)   Depression (PHQ2-9)    PHQ-2 Score: 0  Alcohol Screen: Low Risk (05/12/2024)   Alcohol Screen    Last Alcohol Screening Score (AUDIT): 0  Housing: Low Risk (05/12/2024)   Epic    Unable to Pay for Housing in the Last Year: No    Number of Times Moved in the Last Year: 0    Homeless in the Last Year: No  Utilities: Not At Risk (05/12/2024)   Epic    Threatened with loss of utilities: No  Health Literacy: Adequate Health Literacy (05/12/2024)   B1300 Health Literacy    Frequency of need for help with medical instructions: Never    FAMILY HISTORY: Family History  Problem Relation Age of Onset   Liver cancer Mother    Encopresis Father    COPD Father    Cancer Maternal Aunt    Cancer Maternal Uncle     ALLERGIES:  is allergic to codeine and oxycodone-acetaminophen.  MEDICATIONS:  Current Outpatient Medications  Medication Sig Dispense Refill   albuterol  (PROVENTIL ) (2.5 MG/3ML) 0.083%  nebulizer solution Take 3 mLs (2.5 mg total) by nebulization every 6 (six) hours as needed for wheezing or shortness of breath. 75 mL 12   Ascorbic Acid (VITAMIN C) 1000 MG tablet Take 1,000 mg by mouth daily.     atorvastatin  (LIPITOR) 10 MG tablet TAKE 1 TABLET BY MOUTH EVERY DAY 90 tablet 2   fluticasone -salmeterol (ADVAIR HFA) 230-21 MCG/ACT inhaler Inhale 2 puffs into the lungs 2 (two) times daily. 1 Inhaler 12   Ipratropium-Albuterol  (COMBIVENT  RESPIMAT) 20-100 MCG/ACT AERS respimat TAKE 1 PUFF INTO THE LUNGS FOUR TIMES A DAY AS NEEDED FOR WHEEZING 12 g 12   Multiple Vitamins-Minerals (PRESERVISION AREDS 2+MULTI VIT PO) Take 2 tablets by mouth daily.     Saw Palmetto 1000 MG CAPS Take by mouth.     vitamin D3 (CHOLECALCIFEROL) 25 MCG tablet Take 1,000 Units by mouth daily.     famotidine (PEPCID) 20 MG tablet Take  20 mg by mouth daily.      No current facility-administered medications for this visit.     PHYSICAL EXAMINATION:   Vitals:   09/27/24 1007  BP: 117/79  Pulse: 88  Resp: 16  Temp: 98.1 F (36.7 C)  SpO2: 98%   Filed Weights   09/27/24 1007  Weight: 154 lb (69.9 kg)    Splenomegaly   Physical Exam Vitals and nursing note reviewed.  HENT:     Head: Normocephalic and atraumatic.     Mouth/Throat:     Pharynx: Oropharynx is clear.  Eyes:     Extraocular Movements: Extraocular movements intact.     Pupils: Pupils are equal, round, and reactive to light.  Cardiovascular:     Rate and Rhythm: Normal rate and regular rhythm.  Pulmonary:     Comments: Decreased breath sounds bilaterally.  Abdominal:     Palpations: Abdomen is soft.  Musculoskeletal:        General: Normal range of motion.     Cervical back: Normal range of motion.  Skin:    General: Skin is warm.  Neurological:     General: No focal deficit present.     Mental Status: He is alert and oriented to person, place, and time.  Psychiatric:        Behavior: Behavior normal.        Judgment: Judgment normal.      LABORATORY DATA:  I have reviewed the data as listed Lab Results  Component Value Date   WBC 1.9 (L) 09/27/2024   HGB 11.0 (L) 09/27/2024   HCT 32.8 (L) 09/27/2024   MCV 101.2 (H) 09/27/2024   PLT 112 (L) 09/27/2024   Recent Labs    05/12/24 1211 09/27/24 1014  NA 136 138  K 4.3 4.3  CL 104 102  CO2 25 26  GLUCOSE 101* 99  BUN 12 11  CREATININE 0.96 1.04  CALCIUM  8.6* 9.0  GFRNONAA >60 >60  PROT 6.9 6.7  ALBUMIN 4.3 4.3  AST 12* 11*  ALT 7 <5  ALKPHOS 72 79  BILITOT 0.8 0.6     No results found.   ASSESSMENT & PLAN:   MDS (myelodysplastic syndrome) (HCC) # Asymptomatic from his underlying pancytopenia  however slow progressive since 2021. OCT 2025- - Hypercellular bone marrow (80%) with morphologic dyspoiesis; Morphologic evaluation of the bone marrow reveals a  hypercellular bone  marrow with a myeloid left shift.  The erythroid lineage shows  vascularization and nuclear membrane irregularities.  The megakaryocyte  lineage shows dysplastic forms including hypolobated  forms.  While the  overall blast percentage is approximately 5%, focal areas of clustering  are identified on CD34 immunohistochemical analysis.  Cytogenetics -karyotype - no metaphase cells noted. NGS:  SFSF2; TET2.   # October 2025 -platelets greater than 100; ANC 0.7; hemoglobin 11.  Patient asymptomatic.  R-IPSS: Unable to calculate because of lack of karyotype.;  However-likely low risk.  Discussed average life expectancy is anywhere between 3 to 5 years.# Discussed treatment options include hypomethylating agents; metronomic dosing- hypomethylating agents plus venetoclax.  Patient declines a second opinion.  # Continue surveillance for now-as patient is asymptomatic.  I again reminded the patient to let us  know if has multiple infections-would consider growth factor support.  # Vaccination: s/p  flu shot. [Status post pneumonia vaccination; shingles RSV]  # DISPOSITION: # follow up in 3 months- MD; labs- cbc/cmp; [facility]-Dr.B  All questions were answered. The patient knows to call the clinic with any problems, questions or concerns.   Cindy JONELLE Joe, MD 09/27/2024 1:24 PM

## 2024-09-27 NOTE — Progress Notes (Signed)
 Finished doxycycline for [employer].

## 2024-09-27 NOTE — Assessment & Plan Note (Addendum)
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 OCT 2025- - Hypercellular bone marrow (80%) with morphologic dyspoiesis; Morphologic evaluation of the bone marrow reveals a hypercellular bone  marrow with a myeloid left shift.  The erythroid lineage shows  vascularization and nuclear membrane irregularities.  The megakaryocyte  lineage shows dysplastic forms including hypolobated forms.  While the  overall blast percentage is approximately 5%, focal areas of clustering  are identified on CD34 immunohistochemical analysis.  Cytogenetics -karyotype - no metaphase cells noted. NGS:  SFSF2; TET2.   # October 2025 -platelets greater than 100; ANC 0.7; hemoglobin 11.  Patient asymptomatic.  R-IPSS: Unable to calculate because of lack of karyotype.;  However-likely low risk.  Discussed average life expectancy is anywhere between 3 to 5 years.# Discussed treatment options include hypomethylating agents; metronomic dosing- hypomethylating agents plus venetoclax.  Patient declines a second opinion.  # Continue surveillance for now-as patient is asymptomatic.  I again reminded the patient to let us  know if has multiple infections-would consider growth factor support.  # Vaccination: s/p  flu shot. [Status post pneumonia vaccination; shingles RSV]  # DISPOSITION: # follow up in 3 months- MD; labs- cbc/cmp; [facility]-Dr.B

## 2024-09-28 LAB — ERYTHROPOIETIN: Erythropoietin: 24.4 m[IU]/mL — ABNORMAL HIGH (ref 2.6–18.5)

## 2024-12-26 ENCOUNTER — Inpatient Hospital Stay: Admitting: Internal Medicine

## 2024-12-26 ENCOUNTER — Inpatient Hospital Stay
